# Patient Record
Sex: Male | Born: 1960 | Race: White | Hispanic: No | Marital: Married | Smoking: Former smoker
Health system: Southern US, Community
[De-identification: ages and names within clinical notes are randomized; demographics above are authoritative.]

## PROBLEM LIST (undated history)

## (undated) DIAGNOSIS — U071 COVID-19: Secondary | ICD-10-CM

## (undated) DIAGNOSIS — E119 Type 2 diabetes mellitus without complications: Secondary | ICD-10-CM

## (undated) HISTORY — PX: ROTATOR CUFF REPAIR: SHX139

## (undated) HISTORY — PX: OTHER SURGICAL HISTORY: SHX169

## (undated) HISTORY — PX: KNEE ARTHROSCOPY: SUR90

## (undated) HISTORY — DX: Type 2 diabetes mellitus without complications: E11.9

---

## 1997-12-31 ENCOUNTER — Emergency Department (HOSPITAL_COMMUNITY): Admission: EM | Admit: 1997-12-31 | Discharge: 1997-12-31 | Payer: Self-pay | Admitting: Emergency Medicine

## 1998-11-04 ENCOUNTER — Emergency Department (HOSPITAL_COMMUNITY): Admission: EM | Admit: 1998-11-04 | Discharge: 1998-11-04 | Payer: Self-pay | Admitting: Emergency Medicine

## 2001-04-02 ENCOUNTER — Encounter: Payer: Self-pay | Admitting: Family Medicine

## 2001-04-02 ENCOUNTER — Ambulatory Visit (HOSPITAL_COMMUNITY): Admission: RE | Admit: 2001-04-02 | Discharge: 2001-04-02 | Payer: Self-pay | Admitting: Family Medicine

## 2001-07-09 ENCOUNTER — Emergency Department (HOSPITAL_COMMUNITY): Admission: EM | Admit: 2001-07-09 | Discharge: 2001-07-10 | Payer: Self-pay | Admitting: *Deleted

## 2001-07-09 ENCOUNTER — Encounter: Payer: Self-pay | Admitting: *Deleted

## 2001-07-15 ENCOUNTER — Encounter: Payer: Self-pay | Admitting: Family Medicine

## 2001-07-15 ENCOUNTER — Ambulatory Visit (HOSPITAL_COMMUNITY): Admission: RE | Admit: 2001-07-15 | Discharge: 2001-07-15 | Payer: Self-pay | Admitting: Family Medicine

## 2003-07-31 ENCOUNTER — Observation Stay (HOSPITAL_COMMUNITY): Admission: EM | Admit: 2003-07-31 | Discharge: 2003-07-31 | Payer: Self-pay | Admitting: Emergency Medicine

## 2003-08-23 ENCOUNTER — Ambulatory Visit (HOSPITAL_COMMUNITY): Admission: RE | Admit: 2003-08-23 | Discharge: 2003-08-23 | Payer: Self-pay | Admitting: Orthopedic Surgery

## 2006-08-05 ENCOUNTER — Ambulatory Visit (HOSPITAL_COMMUNITY): Admission: RE | Admit: 2006-08-05 | Discharge: 2006-08-05 | Payer: Self-pay | Admitting: Family Medicine

## 2006-09-02 ENCOUNTER — Ambulatory Visit: Payer: Self-pay | Admitting: Gastroenterology

## 2006-09-05 ENCOUNTER — Ambulatory Visit (HOSPITAL_COMMUNITY): Admission: RE | Admit: 2006-09-05 | Discharge: 2006-09-05 | Payer: Self-pay | Admitting: Gastroenterology

## 2006-09-05 ENCOUNTER — Encounter (INDEPENDENT_AMBULATORY_CARE_PROVIDER_SITE_OTHER): Payer: Self-pay | Admitting: Specialist

## 2006-09-05 ENCOUNTER — Ambulatory Visit: Payer: Self-pay | Admitting: Gastroenterology

## 2008-06-02 ENCOUNTER — Ambulatory Visit (HOSPITAL_BASED_OUTPATIENT_CLINIC_OR_DEPARTMENT_OTHER): Admission: RE | Admit: 2008-06-02 | Discharge: 2008-06-03 | Payer: Self-pay | Admitting: Orthopedic Surgery

## 2009-02-08 ENCOUNTER — Emergency Department (HOSPITAL_COMMUNITY): Admission: EM | Admit: 2009-02-08 | Discharge: 2009-02-08 | Payer: Self-pay | Admitting: Emergency Medicine

## 2009-02-08 ENCOUNTER — Encounter: Payer: Self-pay | Admitting: Orthopedic Surgery

## 2009-02-10 ENCOUNTER — Ambulatory Visit: Payer: Self-pay | Admitting: Orthopedic Surgery

## 2009-02-10 DIAGNOSIS — S60229A Contusion of unspecified hand, initial encounter: Secondary | ICD-10-CM | POA: Insufficient documentation

## 2010-09-11 LAB — POCT HEMOGLOBIN-HEMACUE: Hemoglobin: 14 g/dL (ref 13.0–17.0)

## 2010-10-10 NOTE — Op Note (Signed)
NAME:  Jonathan Woodard, Jonathan Woodard NO.:  [id number]   MEDICAL RECORD NO.:  [id number]          PATIENT TYPE:  AMB   LOCATION:  DSC                          FACILITY:  [facility]   PHYSICIAN:  Loreta Ave, M.D. DATE OF BIRTH:  04/03/1961   DATE OF PROCEDURE:  06/02/2008  DATE OF DISCHARGE:  06/03/2008                               OPERATIVE REPORT   PREOPERATIVE DIAGNOSES:  Right shoulder impingement; biceps tendinitis;  dislocated biceps tendon; partial tearing, subscapularis tendon.   POSTOPERATIVE DIAGNOSIS:  Right shoulder impingement; biceps tendinitis;  dislocated biceps tendon; partial tearing, subscapularis tendon with  marked distal clavicle osteolysis.   PROCEDURES:  1. Right shoulder exam under anesthesia, arthroscopy, debridement      rotator cuff above and below.  2. Resection of intra-articular portion of the biceps.  3. Subacromial decompression of the acromioplasty, coracoacromial      ligament release, excision of distal clavicle.  4. Open extra-articular tenodesis, re-implantation of biceps into the      bicipital groove.  5. Repair of the superficial fibers of the subscapularis, which had      torn off and delaminated.  This was repaired with FiberWire      anchored down into the area of bicipital re-implantation.   SURGEON:  Loreta Ave, MD   ASSISTANT:  Genene Churn. Barry Dienes, [place], present throughout the entire case and  necessary for timely completion of the procedure.   ANESTHESIA:  General.   ESTIMATED BLOOD LOSS:  Minimal.   SPECIMENS:  None.   CULTURES:  None.   COMPLICATIONS:  None.   DRESSING:  Sterile compressive with shoulder immobilizer.   PROCEDURE:  The patient brought to the operating room, placed on the  operating table in supine position.  After adequate anesthesia had been  obtained, right shoulder examined.  Full motion and stable shoulder.  Placed in beach chair position on the shoulder positioner and prepped  and draped in  the usual sterile fashion.  Three portals anterior,  posterior, and lateral.  Shoulder entered with blunt obturator.  Arthroscope introduced.  Shoulder distended and inspected.  Extensive  marked tearing, intra-articular portion of the biceps tendon.  This was  dislocated out of the groove into a delamination tear of the subscap.  The deep layers of subscap is still intact.  Some thinning of the  supraspinatus, no full-thickness tears.  Articular cartilage remaining  labrum intact.  Biceps detached what was left from the top of the  glenoid for extra-articular tenodesis.  Cannula re-directed  subacromially.  Fair amount of attrition on the supraspinatus debrided.  No full-thickness tears.  Type 2 acromion.  Bursa resected.  Acromioplasty to a type 1 acromion releasing CA ligament.  Grade 4  changes AC joint with marked spurring.  Periarticular spurs and lateral  centimeter of clavicle resected.  Adequacy of decompression confirmed  viewing from all portals.  Instruments and fluid removed.  Anterior  incision right over the bicipital groove splitting the deltoid.  Retractor put in place.  Biceps pulled out of the shoulder.  Debrided  back to appropriate  length and good tissue quality.  Captured with a  weaved FiberWire suture.  The subscap deep fibers were intact on the  other side of the groove, but superficial portion of this about 50% of  the thickness had pulled off, delaminated, and a little bit retracted.  Very reasonable tissue quality.  A drill hole was made in the bicipital  groove just adjacent to the subscap.  Two small exiting holes.  FiberWire brought into a big hole and then out through the small holes.  This was then pulled off in tension bringing the biceps down into the  large hole and then the sutures tied over top of this to re-ensure good  implantation.  When that was complete, I used 0 sutures with a free  needle to weave into the subscap, pulled it back over to its  attachment  site and then firmly tied down there.  This gave a good tenodesis as  well as good repair of the subscap with good continuity.  I could still  externally rotate and reasonably without too much tension on the subscap  repair.  Wound irrigated.  Deltoid allowed to close.  Subcutaneous,  subcuticular closure of the incision.  Portals closed with nylon.  Sterile compressive dressing applied.  Shoulder immobilizer applied.  Anesthesia reversed.  Brought to the recovery room.  Tolerated the  surgery well.  No complications.      Loreta Ave, M.D.  Electronically Signed     DFM/MEDQ  D:  06/03/2008  T:  06/03/2008  Job:  [id number]

## 2010-10-13 NOTE — Op Note (Signed)
NAME:  Jonathan Woodard, Jonathan Woodard                  ACCOUNT NO.:  [id number]   MEDICAL RECORD NO.:  [id number]          PATIENT TYPE:  AMB   LOCATION:  DAY                           FACILITY:  [facility]   PHYSICIAN:  Kassie Mends, M.D.      DATE OF BIRTH:  26-Feb-1961   DATE OF PROCEDURE:  09/05/2006  DATE OF DISCHARGE:                               OPERATIVE REPORT   REFERRING PHYSICIAN:  Angus G. Renard Matter, MD.   INDICATION FOR EXAM:  [name] is a 50 year old male who presents with  heme-positive stool.   PROCEDURES:  1. Colonoscopy.  2. Esophagogastroduodenoscopy with cold forceps biopsies.   FINDINGS:  1. Sigmoid diverticulosis.  2. External and internal hemorrhoids.  Otherwise no polyps, masses,      inflammatory changes or AVMs seen.  3. Diffuse antral erythema and erosions without ulcerations.      Otherwise normal esophagus without evidence of Barrett's, erosions      or ulcerations.  Normal duodenal bulb and second portion of the      duodenum.   RECOMMENDATIONS:  1. Screening colonoscopy in 10 years.  2. High fiber diet.  He should also avoid gastric irritants.  He is      given a handout on diverticulosis, hemorrhoids, high-fiber diet and      gastritis as well as gastric irritants.  3. He should avoid aspirin and anti-inflammatory drugs for 30 days.  4. He is to begin omeprazole 20 mg daily for 30 days.  He is given a      prescription for omeprazole.  5. He has a follow-up appointment to see me on June10, 2008, at 9:30      a.m.   PROCEDURE TECHNIQUE:  Physical exam was performed and informed consent  was obtained from the patient after explaining the benefits, risks and  alternatives to the procedure.  The patient was connected to the monitor  and placed in the left lateral position.  Continuous oxygen was provided  by nasal cannula and IV medicine administered through an indwelling  cannula.  After administration of sedation and rectal exam, the  patient's rectum was intubated and  the scope was advanced under direct  visualization to the cecum.  The scope was subsequently removed slowly  by carefully examining the color, texture, anatomy and integrity of the  mucosa on the way out.   After the colonoscopy, the diagnostic gastroscope was used to intubate  the patient's esophagus.  The scope was advanced under direct  visualization to the second portion of the duodenum.  The scope was  subsequently removed slowly by carefully examining the color, texture,  anatomy and integrity of the mucosa on the way out.  The patient was  recovered in endoscopy and discharged home in satisfactory condition.      Kassie Mends, M.D.  Electronically Signed     SM/MEDQ  D:  09/05/2006  T:  09/05/2006  Job:  [id number]   cc:   Angus G. Renard Matter, MD  Fax: [phone]

## 2010-10-13 NOTE — Op Note (Signed)
NAME:  Jonathan Woodard, Jonathan Woodard                            ACCOUNT NO.:  [id number]   MEDICAL RECORD NO.:  [id number]                   PATIENT TYPE:  OBV   LOCATION:  1827                                 FACILITY:  [facility]   PHYSICIAN:  Jimmye Norman III, M.D.               DATE OF BIRTH:  1961/02/23   DATE OF PROCEDURE:  07/31/2003  DATE OF DISCHARGE:  07/31/2003                                 OPERATIVE REPORT   PREOPERATIVE DIAGNOSIS:  Perianal abscess with possible fistula.   POSTOPERATIVE DIAGNOSIS:  Perianal abscess with fistula in the 12 o'clock  position.   PROCEDURE:  1. Incision and drainage of perianal and peroneal abscess.  2. Fistulotomy.   SURGEON:  Jimmye Norman, M.D.   ANESTHESIA:  General endotracheal done in the lithotomy position.   ESTIMATED BLOOD LOSS:  Less than 25-30 mL.   COMPLICATIONS:  None.   CONDITION:  Stable.   INDICATIONS:  The patient is a 50 year old with a recurrent perianal abscess  who comes in for drainage.   FINDINGS:  At the time of surgery, the patient was found to have a large  indurated area at the 12 o'clock position extending laterally to about 2  o'clock.  About 5-6 mL of pus was drained from the wound as we opened it,  but it had spontaneously started to drain on its own.  The fistula was in  the 12 o'clock.  It did not go above the sphincter and could be opened by  simply opening mucosa of that area over a hemostat clamp.   DESCRIPTION OF PROCEDURE:  The patient was taken to the operating room and  placed on the table in the supine position. After an adequate amount of  endotracheal anesthetic was administered, he was placed in the lithotomy and  prepped with Betadine.   The opening of the abscess was almost directly at 12 o'clock with that being  directly anterior.  We inserted an anal speculum where we could see the  fistulous tract open very easily using a hemostat clamp.  It opened  approximately just above the dentate line.  It did not  appear to go behind  the muscle.  We split it along the tract using electrocautery.  We  subsequently rubbed out the internal contents using a rough gauze.  Once we  had done so, we drained the indurated area superiorly along going towards  the scrotum and laterally to the left where there did not appear to be any  further pockets of pus but mostly induration.  Once we had done so, we did  pack these areas with  about 3 feet of 1/2 inch Iodoform Nugauze.  We all placed a Dibucaine soaked  Gelfoam in the anus to help with hemostasis and pain control.  The patient  was then taken to the recovery room in stable condition with dressing in  place.  Kathrin Ruddy, M.D.    JW/MEDQ  D:  07/31/2003  T:  08/01/2003  Job:  [id number]

## 2010-10-13 NOTE — H&P (Signed)
NAME:  Jonathan Woodard, Jonathan Woodard                            ACCOUNT NO.:  [id number]   MEDICAL RECORD NO.:  [id number]                   PATIENT TYPE:  OBV   LOCATION:  1827                                 FACILITY:  [facility]   PHYSICIAN:  Jimmye Norman III, M.D.               DATE OF BIRTH:  May 29, 1960   DATE OF ADMISSION:  07/31/2003  DATE OF DISCHARGE:  07/31/2003                                HISTORY & PHYSICAL   PREOPERATIVE HISTORY AND PHYSICAL:   IDENTIFICATION/CHIEF COMPLAINT:  The patient is a 50 year old with a  perianal abscess.   HISTORY OF PRESENT ILLNESS:  I was called this morning by the emergency room  physician Dr. Cathren Laine for a gentleman with a perianal abscess which has  been there since about 3:30 to 4 o'clock in the morning.  He had drainage on  this occasion since yesterday, actually since he came into the ER, but he  had significant pain prior to that with swelling in the left gluteal  perianal area and also extending up towards the scrotum.  He is not diabetic  but he has had recurrent abscesses before.   His past medical history is significant for rheumatoid fever in 1976 and he  has been told to take antibiotics with dental procedures but has no known  murmur.  He takes no medications.  He has no known drug allergies.  Only  surgical history has been drainage of perirectal abscesses before.   REVIEW OF SYSTEMS:  He has had bowel movements which are painful, he has had  drainage before but not recently, no abdominal pain, he has no chest pain or  shortness of breath.   SOCIAL HISTORY:  The patient works in a Psychologist, prison and probation services.  He is an ex-smoker  and drinks occasionally.   PHYSICAL EXAMINATION:  VITALS:  He is afebrile, other vital signs are  stable.  HEENT:  He is normocephalic and atraumatic and anicteric.  NECK:  Supple.  CHEST:  Clear.  CARDIAC:  No murmurs, gallops, rubs, or heaves.  ABDOMEN:  Soft and nontender.  RECTAL:  He has got very tender firm area just  to the left of the scrotum at  2 o'clock and directly anteriorly at 12 o'clock.  There is some purulent  drainage.   IMPRESSION:  Perianal/perirectal abscess.   PLAN:  Examine under anesthesia, drain the abscess, and also do a  fistulotomy if possible.  The risks and benefits of the procedure have been  explained to the patient and he wishes to proceed as soon as possible.                                                Kathrin Ruddy, M.D.    JW/MEDQ  D:  07/31/2003  T:  07/31/2003  Job:  [id number]

## 2010-10-13 NOTE — Consult Note (Signed)
NAME:  Jonathan Woodard, Jonathan Woodard                  ACCOUNT NO.:  [id number]   MEDICAL RECORD NO.:  [id number]          PATIENT TYPE:  AMB   LOCATION:  DAY                           FACILITY:  [facility]   PHYSICIAN:  Kassie Mends, M.D.      DATE OF BIRTH:  08/16/1960   DATE OF CONSULTATION:  09/02/1999  DATE OF DISCHARGE:                                 CONSULTATION   REASON FOR CONSULTATION:  Heme-positive stool.   HISTORY OF PRESENT ILLNESS:  Jonathan Woodard is a 50 year old male who was  seen on March 13th for a routine physical exam.  He had heme-positive  stool on physical exam.  He had a hemoglobin checked on March 14th,  which was 14.3.  He denies seeing any blood in his stool.  He does have  a history of having a boil being lanced occasionally over the last 10-12  years.  He states it was not acting up at the time.  He states he was  not acting up at the time, denies any history of hemorrhoids.  He has  two to three bowel movements a day.  He denies an constipation or  diarrhea.  He does not use aspirin, BC's, Goody's, Ibuprofen or Aleve.  He uses Tylenol as needed.  He denies any heartburn.  He has  indigestion, depending on what he eats.  He has no problem swallowing,  black stools, weight loss or abdominal pain.   PAST MEDICAL HISTORY:  None.   PAST SURGICAL HISTORY:  None.   ALLERGIES:  No known drug allergies.   MEDICATIONS:  None.   FAMILY HISTORY:  He has no family history of colon cancer, colon polyps.  His mother had what sounds like peritoneal carcinomatosis of unclear  etiology.   SOCIAL HISTORY:  He is married and has two girls.  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  He denies any tobacco or alcohol use.   REVIEW OF SYSTEMS:  As per the HPI, otherwise all systems are negative.   PHYSICAL EXAMINATION:  VITAL SIGNS:  Weight 224 pounds, height 5 foot 11  inches, BMI 31.2 (obese).  Temperature is 98, blood pressure 108/78,  pulse 92.GENERAL:  He is in no apparent distress,  alert and oriented  x4.HEENT:  Atraumatic normocephalic, pupils equal and reactive to light.  Mouth:  No oral lesions.  Posterior pharynx without erythema or exudate.  NECK:  Has full range of motion, no lymphadenopathy.LUNGS:  Clear to  auscultation bilaterally.CARDIOVASCULAR:  Regular rhythm with no murmur,  normal S1 and S2.ABDOMEN:  Bowel sounds present, soft, nontender,  nondistended.  No rebound, no guarding, no hepatosplenomegaly, abdominal  bruits or pulsatile masses, obese. EXTREMITIES:  Without cyanosis,  clubbing or edema.  NEURO:  He has no focal neurologic deficits.   LABORATORY:  From March 2008 - Sodium 141, potassium 4.4, BUN 11,  creatinine 0.185, AST 19, ALT 19, albumin 4.3, platelets 300,  triglycerides 55, TSH 2.155.   ASSESSMENT:  Mr.. Woodard is a 50 year old male with heme-positive stool.  Most likely etiology is in general hemorrhoids.  The  differential  diagnosis includes colorectal polyps and a low likelihood of colorectal  cancer.  He could have an upper GI source, such as esophagitis or  gastritis.  Thank you for allowing me to see Jonathan Woodard in consultation.  My recommendations follow.   RECOMMENDATIONS:  1. Jonathan Woodard will be scheduled for a colonoscopy with Osmo prep.  If      no source for his heme-positive stools are found, then an EGD will      be performed.  A capsule endoscopy will be performed to complete      his evaluation, if no source is found on colonoscopy or EGD.  2. He may follow up with me as needed.      Kassie Mends, M.D.  Electronically Signed     SM/MEDQ  D:  09/02/2006  T:  09/02/2006  Job:  [id number]   cc:   Jonathan G. Renard Matter, MD  Fax: [phone]

## 2011-02-18 ENCOUNTER — Emergency Department (HOSPITAL_COMMUNITY): Payer: BC Managed Care – PPO

## 2011-02-18 ENCOUNTER — Emergency Department (HOSPITAL_COMMUNITY)
Admission: EM | Admit: 2011-02-18 | Discharge: 2011-02-18 | Disposition: A | Discharge status: Home / Self Care | Payer: BC Managed Care – PPO | Attending: Emergency Medicine | Admitting: Emergency Medicine

## 2011-02-18 DIAGNOSIS — S40012A Contusion of left shoulder, initial encounter: Secondary | ICD-10-CM

## 2011-02-18 DIAGNOSIS — S43402A Unspecified sprain of left shoulder joint, initial encounter: Secondary | ICD-10-CM

## 2011-02-18 DIAGNOSIS — IMO0002 Reserved for concepts with insufficient information to code with codable children: Secondary | ICD-10-CM | POA: Insufficient documentation

## 2011-02-18 DIAGNOSIS — S40019A Contusion of unspecified shoulder, initial encounter: Secondary | ICD-10-CM | POA: Insufficient documentation

## 2011-02-18 DIAGNOSIS — S20219A Contusion of unspecified front wall of thorax, initial encounter: Secondary | ICD-10-CM | POA: Insufficient documentation

## 2011-02-18 DIAGNOSIS — S060X1A Concussion with loss of consciousness of 30 minutes or less, initial encounter: Secondary | ICD-10-CM | POA: Insufficient documentation

## 2011-02-18 MED ORDER — OXYCODONE-ACETAMINOPHEN 5-325 MG PO TABS: 2.0000 | ORAL_TABLET | ORAL | Status: AC | PRN

## 2011-02-18 MED ORDER — ONDANSETRON 8 MG PO TBDP
8.0000 mg | ORAL_TABLET | Freq: Once | ORAL | Status: AC
  Administered 2011-02-18: 8 mg via ORAL
  Filled 2011-02-18: qty 1

## 2011-02-18 MED ORDER — HYDROMORPHONE HCL 1 MG/ML IJ SOLN
1.0000 mg | Freq: Once | INTRAMUSCULAR | Status: AC
  Administered 2011-02-18: 1 mg via INTRAVENOUS
  Filled 2011-02-18: qty 1

## 2011-02-18 NOTE — ED Notes (Signed)
Cervical collar applied
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
## 2011-02-18 NOTE — ED Notes (Signed)
Patient c-collar removed via dr. Fonnie Jarvis

## 2011-02-18 NOTE — ED Notes (Signed)
Pt remains flat on stretcher, states is feeling better, good color, skin warm and dry, pt denies complaints.
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
## 2011-02-18 NOTE — ED Notes (Signed)
c-collar placed in triage, report given to penny accepting rn

## 2011-02-18 NOTE — ED Notes (Signed)
Pt became unresponsive for a few minutes after receiving dilaudid.   Pt brought back around with sternal rub, diaphoretic, cool, clammy.   Pt placed back on stretcher in supine position with dr bednar at bedside.  Pt with eyes open now, denies complaints.   Pt placed on nrb oxygen 15 lpm.

## 2011-03-22 ENCOUNTER — Encounter (HOSPITAL_COMMUNITY): Payer: Self-pay | Admitting: *Deleted

## 2011-03-22 ENCOUNTER — Emergency Department (HOSPITAL_COMMUNITY)
Admission: EM | Admit: 2011-03-22 | Discharge: 2011-03-22 | Disposition: A | Discharge status: Home / Self Care | Payer: BC Managed Care – PPO | Attending: Emergency Medicine | Admitting: Emergency Medicine

## 2011-03-22 DIAGNOSIS — X58XXXA Exposure to other specified factors, initial encounter: Secondary | ICD-10-CM | POA: Insufficient documentation

## 2011-03-22 DIAGNOSIS — T148XXA Other injury of unspecified body region, initial encounter: Secondary | ICD-10-CM | POA: Insufficient documentation

## 2011-03-22 DIAGNOSIS — Z87891 Personal history of nicotine dependence: Secondary | ICD-10-CM | POA: Insufficient documentation

## 2011-03-22 DIAGNOSIS — S39012A Strain of muscle, fascia and tendon of lower back, initial encounter: Secondary | ICD-10-CM

## 2011-03-22 DIAGNOSIS — M25519 Pain in unspecified shoulder: Secondary | ICD-10-CM | POA: Insufficient documentation

## 2011-03-22 DIAGNOSIS — R10819 Abdominal tenderness, unspecified site: Secondary | ICD-10-CM | POA: Insufficient documentation

## 2011-03-22 DIAGNOSIS — M549 Dorsalgia, unspecified: Secondary | ICD-10-CM | POA: Insufficient documentation

## 2011-03-22 DIAGNOSIS — S335XXA Sprain of ligaments of lumbar spine, initial encounter: Secondary | ICD-10-CM | POA: Insufficient documentation

## 2011-03-22 LAB — COMPREHENSIVE METABOLIC PANEL
ALT: 17 U/L (ref 0–53)
AST: 19 U/L (ref 0–37)
Albumin: 3.8 g/dL (ref 3.5–5.2)
Alkaline Phosphatase: 89 U/L (ref 39–117)
BUN: 13 mg/dL (ref 6–23)
Chloride: 101 mEq/L (ref 96–112)
Potassium: 3.7 mEq/L (ref 3.5–5.1)
Sodium: 138 mEq/L (ref 135–145)
Total Bilirubin: 0.2 mg/dL — ABNORMAL LOW (ref 0.3–1.2)
Total Protein: 6.7 g/dL (ref 6.0–8.3)

## 2011-03-22 LAB — URINALYSIS, ROUTINE W REFLEX MICROSCOPIC
Bilirubin Urine: NEGATIVE
Glucose, UA: NEGATIVE mg/dL
Hgb urine dipstick: NEGATIVE
Ketones, ur: NEGATIVE mg/dL
Leukocytes, UA: NEGATIVE
Protein, ur: NEGATIVE mg/dL
pH: 6.5 (ref 5.0–8.0)

## 2011-03-22 LAB — DIFFERENTIAL
Basophils Absolute: 0 10*3/uL (ref 0.0–0.1)
Basophils Relative: 0 % (ref 0–1)
Eosinophils Absolute: 0.3 10*3/uL (ref 0.0–0.7)
Monocytes Relative: 8 % (ref 3–12)
Neutro Abs: 8 10*3/uL — ABNORMAL HIGH (ref 1.7–7.7)
Neutrophils Relative %: 74 % (ref 43–77)

## 2011-03-22 LAB — CBC
Hemoglobin: 13.4 g/dL (ref 13.0–17.0)
MCHC: 33.4 g/dL (ref 30.0–36.0)
Platelets: 277 10*3/uL (ref 150–400)

## 2011-03-22 MED ORDER — NAPROXEN 500 MG PO TABS: 500.0000 mg | ORAL_TABLET | Freq: Two times a day (BID) | ORAL | Status: AC

## 2011-03-22 MED ORDER — KETOROLAC TROMETHAMINE 30 MG/ML IJ SOLN
30.0000 mg | Freq: Once | INTRAMUSCULAR | Status: AC
  Administered 2011-03-22: 30 mg via INTRAVENOUS
  Filled 2011-03-22 (×2): qty 1

## 2011-03-22 NOTE — ED Notes (Signed)
Blood and urine to lab
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
## 2013-01-28 ENCOUNTER — Other Ambulatory Visit: Payer: Self-pay | Admitting: Orthopedic Surgery

## 2013-01-30 ENCOUNTER — Encounter (HOSPITAL_BASED_OUTPATIENT_CLINIC_OR_DEPARTMENT_OTHER): Admission: RE | Payer: Self-pay | Source: Ambulatory Visit

## 2013-01-30 ENCOUNTER — Ambulatory Visit (HOSPITAL_BASED_OUTPATIENT_CLINIC_OR_DEPARTMENT_OTHER)
Admission: RE | Admit: 2013-01-30 | Payer: BC Managed Care – PPO | Source: Ambulatory Visit | Admitting: Orthopedic Surgery

## 2013-01-30 SURGERY — SHOULDER ARTHROSCOPY WITH ROTATOR CUFF REPAIR AND SUBACROMIAL DECOMPRESSION
Anesthesia: General | Laterality: Left

## 2014-11-04 ENCOUNTER — Encounter (HOSPITAL_COMMUNITY): Payer: Self-pay | Admitting: Emergency Medicine

## 2014-11-04 ENCOUNTER — Emergency Department (HOSPITAL_COMMUNITY)
Admission: EM | Admit: 2014-11-04 | Discharge: 2014-11-04 | Disposition: A | Discharge status: Home / Self Care | Payer: BLUE CROSS/BLUE SHIELD | Attending: Emergency Medicine | Admitting: Emergency Medicine

## 2014-11-04 DIAGNOSIS — Y9389 Activity, other specified: Secondary | ICD-10-CM | POA: Insufficient documentation

## 2014-11-04 DIAGNOSIS — Y9289 Other specified places as the place of occurrence of the external cause: Secondary | ICD-10-CM | POA: Diagnosis not present

## 2014-11-04 DIAGNOSIS — Z87891 Personal history of nicotine dependence: Secondary | ICD-10-CM | POA: Insufficient documentation

## 2014-11-04 DIAGNOSIS — Y998 Other external cause status: Secondary | ICD-10-CM | POA: Diagnosis not present

## 2014-11-04 DIAGNOSIS — T1502XA Foreign body in cornea, left eye, initial encounter: Secondary | ICD-10-CM | POA: Diagnosis not present

## 2014-11-04 DIAGNOSIS — X58XXXA Exposure to other specified factors, initial encounter: Secondary | ICD-10-CM | POA: Diagnosis not present

## 2014-11-04 DIAGNOSIS — S0592XA Unspecified injury of left eye and orbit, initial encounter: Secondary | ICD-10-CM | POA: Diagnosis present

## 2014-11-04 MED ORDER — TETRACAINE HCL 0.5 % OP SOLN
2.0000 [drp] | Freq: Once | OPHTHALMIC | Status: AC
  Administered 2014-11-04: 2 [drp] via OPHTHALMIC
  Filled 2014-11-04: qty 2

## 2014-11-04 NOTE — ED Notes (Signed)
Pt. Reports left eye pain starting today. 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

## 2014-11-04 NOTE — Discharge Instructions (Signed)
Eye, Foreign Body °A foreign body is an object that should not be there. The object could be near, on, or in the eye. °HOME CARE °If your doctor prescribes an eye patch: °· Keep the eye patch on. Do this until you see your doctor again. °· Do not remove the patch to put in medicine unless your doctor tells you. °· Retape it as it was before: °¨ When replacing the patch. °¨ If the patch comes loose. °· Do not drive or use machinery. °· Only take medicine as told by your doctor. °If your doctor does not prescribe an eye patch: °· Keep the eye closed as much as possible. °· Do not rub the eye. °· Wear dark glasses in bright light. °· Do not wear contact lenses until the eye feels normal, or as told by your doctor. °· Wear protective eye covering, especially when using high speed tools. °· Only take medicine as told by your doctor. °GET HELP RIGHT AWAY IF:  °· Your pain gets worse. °· Your vision changes. °· You have problems with the eye patch. °· The injury gets larger. °· There is fluid (discharge) coming from the eye. °· You get puffiness (swelling) and soreness. °· You have an oral temperature above 102° F (38.9° C), not controlled by medicine. °· Your baby is older than 3 months with a rectal temperature of 102° F (38.9° C) or higher. °· Your baby is 3 months old or younger with a rectal temperature of 100.4° F (38° C) or higher. °MAKE SURE YOU:  °· Understand these instructions. °· Will watch your condition. °· Will get help right away if you are not doing well or get worse. °Document Released: 11/01/2009 Document Revised: 08/06/2011 Document Reviewed: 10/09/2012 °Exit[employer]® Patient Information ©2015 [employer]. This information is not intended to replace advice given to you by your health care provider. Make sure you discuss any questions you have with your health care provider. ° °

## 2014-11-04 NOTE — ED Provider Notes (Signed)
CSN: [id number]     Arrival date & time 11/04/14  1917 History   First MD Initiated Contact with Patient 11/04/14 1943     Chief Complaint  Patient presents with  . Eye Pain     (Consider location/radiation/quality/duration/timing/severity/associated sxs/prior Treatment) HPI   Jonathan Woodard is a 54 y.o. male who presents to the Emergency Department complaining of foreign body sensation to left eye.  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  Reports incident occurred earlier on the day of arrival.  He tried flushing with water, but no other therapies.  He has blurred vision of the left eye and reports photophobia.  He denies any other symptoms.  States he was wearing a welders hat and denies contact use.     Past Medical History  Diagnosis Date  . Concussion    Past Surgical History  Procedure Laterality Date  . Rotator cuff repair     No family history on file. History  Substance Use Topics  . Smoking status: Former Games developer  . Smokeless tobacco: Not on file  . Alcohol Use: No    Review of Systems  Constitutional: Negative for fever, chills and fatigue.  HENT: Negative for sore throat and trouble swallowing.   Eyes: Positive for pain.  Respiratory: Negative for cough, shortness of breath and wheezing.   Cardiovascular: Negative for chest pain and palpitations.  Gastrointestinal: Negative for nausea, vomiting, abdominal pain and blood in stool.  Genitourinary: Negative for dysuria, hematuria and flank pain.  Musculoskeletal: Negative for myalgias, back pain, arthralgias, neck pain and neck stiffness.  Skin: Negative for rash.  Neurological: Negative for dizziness, weakness and numbness.  Hematological: Does not bruise/bleed easily.  All other systems reviewed and are negative.     Allergies  Dilaudid  Home Medications   Prior to Admission medications   Not on File   BP 139/82 mmHg  Pulse 82  Temp(Src) 97.6 F (36.4 C) (Oral)  Resp 16  Ht   (1.803 m)  Wt 218 lb (98.884 kg)  BMI 30.42 kg/m2  SpO2 97% Physical Exam  Constitutional: He is oriented to person, place, and time. He appears well-developed and well-nourished. No distress.  HENT:  Head: Normocephalic and atraumatic.  Eyes: EOM are normal. Pupils are equal, round, and reactive to light. Lids are everted and swept, no foreign bodies found. Right conjunctiva is not injected. Left conjunctiva is not injected.  Pin point FB present to left cornea at 5:00 o'clock.  Slit lamp exam reveals negative Seidel's sign, no hyphema. Globe intact. No corneal burns  Cardiovascular: Normal rate and regular rhythm.   Pulmonary/Chest: Effort normal and breath sounds normal. No respiratory distress.  Musculoskeletal: Normal range of motion.  Neurological: He is alert and oriented to person, place, and time. He exhibits normal muscle tone. Coordination normal.  Skin: Skin is warm and dry. No rash noted.  Nursing note and vitals reviewed.   ED Course  Procedures (including critical care time) Labs Review Labs Reviewed - No data to display  Imaging Review No results found.   EKG Interpretation None      MDM   Final diagnoses:  Foreign body in cornea, left, initial encounter   Pt also seen by Dr Roselyn Bering.    Small FB remains to cornea of left eye despite multiple attempts to remove.  No rust ring present.  Will consult ophtho. Eye patched for comfort  Consulted Dr. Lita Mains.  Will see pt  in his office tomorrow at 8:30 am.      Pauline Aus, PA-C 11/07/14 0011  Linwood Dibbles, MD 11/09/14 1136

## 2014-11-04 NOTE — ED Notes (Signed)
Patch placed over pt's LT eye

## 2016-07-06 ENCOUNTER — Telehealth: Payer: Self-pay | Admitting: Gastroenterology

## 2016-07-06 NOTE — Telephone Encounter (Signed)
Per Leeanne RioSusan, [facility] called to see when pt was due. I will send out Recall letter late March.

## 2016-07-06 NOTE — Telephone Encounter (Signed)
Pt is due a 10 yr colonoscopy in April 2018. It wasn't nic'ed on the recall list.

## 2016-07-31 NOTE — Telephone Encounter (Signed)
Tried to call with no answer  

## 2016-08-01 NOTE — Telephone Encounter (Signed)
LMOM to call back

## 2016-08-06 NOTE — Telephone Encounter (Signed)
LMOM to call back

## 2016-08-09 NOTE — Telephone Encounter (Signed)
Tried to call with no answer . Letter mailed

## 2016-08-31 ENCOUNTER — Emergency Department (HOSPITAL_COMMUNITY): Payer: Self-pay

## 2016-08-31 ENCOUNTER — Emergency Department (HOSPITAL_COMMUNITY)
Admission: EM | Admit: 2016-08-31 | Discharge: 2016-08-31 | Disposition: A | Discharge status: Home / Self Care | Payer: Self-pay | Attending: Emergency Medicine | Admitting: Emergency Medicine

## 2016-08-31 ENCOUNTER — Encounter (HOSPITAL_COMMUNITY): Payer: Self-pay

## 2016-08-31 DIAGNOSIS — W231XXA Caught, crushed, jammed, or pinched between stationary objects, initial encounter: Secondary | ICD-10-CM | POA: Insufficient documentation

## 2016-08-31 DIAGNOSIS — Z87891 Personal history of nicotine dependence: Secondary | ICD-10-CM | POA: Insufficient documentation

## 2016-08-31 DIAGNOSIS — Y999 Unspecified external cause status: Secondary | ICD-10-CM | POA: Insufficient documentation

## 2016-08-31 DIAGNOSIS — Y939 Activity, unspecified: Secondary | ICD-10-CM | POA: Insufficient documentation

## 2016-08-31 DIAGNOSIS — Y929 Unspecified place or not applicable: Secondary | ICD-10-CM | POA: Insufficient documentation

## 2016-08-31 DIAGNOSIS — S60221A Contusion of right hand, initial encounter: Secondary | ICD-10-CM | POA: Insufficient documentation

## 2016-08-31 NOTE — Discharge Instructions (Signed)
Elevate and apply ice packs on and off to your hand.  No heavy lifting with the right hand for 1 week. Call Dr. Mort Sawyers office to arrange a follow-up appointment next week if not improving.  You can take Ibuprofen if needed for pain

## 2016-08-31 NOTE — ED Triage Notes (Signed)
Pt reports that right hand got squeezed between barn and bull yesterday. Right hand and wrist swollen at this time

## 2016-08-31 NOTE — ED Provider Notes (Signed)
[facility]EMER[facility]Y DEPT Provider Note   CSN: [id number] Arrival date & time: 08/31/16  1130     History   Chief Complaint Chief Complaint  Patient presents with  . Hand Pain    HPI Jonathan Woodard is a 56 y.o. male.  HPI  Jonathan Woodard is a 56 y.o. male who presents to the Emergency Department complaining of a crush injury to the right hand that occurred one day prior to arrival. He states that his hand was mashed between a bull and a piece of wood on the barn.  He describes a throbbing pain and swelling of the hand.  Pain with gripping.  Pain radiates to his wrist.  No alleviating factors.  He denies numbness to the fingers, pain to the elbow, or open wounds.   Past Medical History:  Diagnosis Date  . Concussion     Patient Active Problem List   Diagnosis Date Noted  . CONTUSION, RIGHT HAND 02/10/2009    Past Surgical History:  Procedure Laterality Date  . ROTATOR CUFF REPAIR         Home Medications    Prior to Admission medications   Not on File    Family History No family history on file.  Social History Social History  Substance Use Topics  . Smoking status: Former Games developer  . Smokeless tobacco: Never Used  . Alcohol use No     Allergies   Dilaudid [hydromorphone hcl]   Review of Systems Review of Systems  Constitutional: Negative for chills and fever.  Musculoskeletal: Positive for arthralgias (right hand pain) and joint swelling.  Skin: Negative for color change and wound.  Neurological: Negative for weakness and numbness.  Hematological: Does not bruise/bleed easily.  All other systems reviewed and are negative.    Physical Exam Updated Vital Signs BP (!) 144/90 (BP Location: Right Arm)   Pulse 89   Temp 98.3 F (36.8 C) (Oral)   Resp 17   Ht  (1.803 m)   Wt 102.1 kg   SpO2 98%   BMI 31.38 kg/m   Physical Exam  Constitutional: He is oriented to person, place, and time. He appears well-developed and well-nourished. No distress.   HENT:  Head: Normocephalic and atraumatic.  Cardiovascular: Normal rate, regular rhythm and intact distal pulses.   Pulmonary/Chest: Effort normal and breath sounds normal.  Musculoskeletal: He exhibits tenderness. He exhibits no edema or deformity.  Diffuse ttp of the dorsal right hand.  No bruising or bony deformity.  Patient has full ROM. Compartments soft.  Neurological: He is alert and oriented to person, place, and time. No sensory deficit. He exhibits normal muscle tone. Coordination normal.  Skin: Skin is warm and dry.  Nursing note and vitals reviewed.    ED Treatments / Results  Labs (all labs ordered are listed, but only abnormal results are displayed) Labs Reviewed - No data to display  EKG  EKG Interpretation None       Radiology Dg Hand Complete Right  Result Date: 08/31/2016 CLINICAL DATA:  Right hand pain.  Injured yesterday. EXAM: RIGHT HAND - COMPLETE 3+ VIEW COMPARISON:  02/08/2009 FINDINGS: There is a subtle lucency along the radial base of the fourth metacarpal which may be projectional versus a subtle nondisplaced fracture. Correlate with point tenderness. There is no other fracture or dislocation. 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 There is mild osteoarthritis of the first IP joint. There is mild osteoarthritis of the first MCP joint. There  is mild osteoarthritis of the second DIP joint. There is no soft tissue swelling. IMPRESSION: Subtle lucency along the radial base of the fourth metacarpal which may be projectional versus a subtle nondisplaced fracture. Correlate with point tenderness. No other fracture or dislocation. Electronically Signed   By: Elige Ko   On: 08/31/2016 12:21    Procedures Procedures (including critical care time)  Medications Ordered in ED Medications - No data to display   Initial Impression / Assessment and Plan / ED Course  I have reviewed the triage vital signs and the nursing notes.  Pertinent labs &  imaging results that were available during my care of the patient were reviewed by me and considered in my medical decision making (see chart for details).     Patient with likely contusion of the right hand. No tenderness of the right wrist or fourth metacarpal. Patient has full range of motion of the fourth finger without tenderness. Doubtful that irregularity seen on x-ray is an acute fracture.   Neurovascularly intact. No open wounds. Patient agrees to RICE therapy, Jones dressing applied, has ibuprofen at home for pain.  I have recommended orthopedic follow-up next week if his symptoms are not improving, patient has seen Dr. Romeo Apple in the past and prefers to follow-up with him.  Final Clinical Impressions(s) / ED Diagnoses   Final diagnoses:  Contusion of right hand, initial encounter    New Prescriptions New Prescriptions   No medications on file     Pauline Aus, Cordelia Poche 09/01/16 1456    Eber Hong, MD 09/01/16 1945

## 2017-02-19 ENCOUNTER — Ambulatory Visit (HOSPITAL_COMMUNITY): Payer: Self-pay | Attending: Orthopedic Surgery

## 2017-02-19 DIAGNOSIS — M25561 Pain in right knee: Secondary | ICD-10-CM | POA: Insufficient documentation

## 2017-02-19 DIAGNOSIS — R262 Difficulty in walking, not elsewhere classified: Secondary | ICD-10-CM | POA: Insufficient documentation

## 2017-02-19 DIAGNOSIS — M6281 Muscle weakness (generalized): Secondary | ICD-10-CM | POA: Insufficient documentation

## 2017-02-19 NOTE — Therapy (Signed)
[facility] [address] [place], [place], [address] Phone: [phone]   Fax:  [phone]  Physical Therapy Evaluation  Patient Details  Name: Jonathan Woodard MRN: [id number] Date of Birth: May 15, 1961 Referring Provider: Frederico Hamman   Encounter Date: 02/19/2017      PT End of Session - 02/19/17 1048    Visit Number 1   Number of Visits 12   Date for PT Re-Evaluation 04/02/17   Authorization Type Pt is self pay, does not have insurance    Authorization - Visit Number 1   Authorization - Number of Visits 12   PT Start Time [phone]   PT Stop Time 1031   PT Time Calculation (min) 45 min   Activity Tolerance Patient tolerated treatment well;No increased pain   Behavior During Therapy WFL for tasks assessed/performed      Past Medical History:  Diagnosis Date  . Concussion     Past Surgical History:  Procedure Laterality Date  . ROTATOR CUFF REPAIR      There were no vitals filed for this visit.       Subjective Assessment - 02/19/17 0950    Subjective Pt reports undergoing Rt knee arthroscopy about 4-6 weeeks ago, unable to recall date. No acute trauma, but reports gradual progression of pain and swelling over time. He was haivign constant sorness and swelling, with sleep disturbance.      Limitations Sitting   How long can you sit comfortably? not limited, but worsening stiffness thereafter   How long can you stand comfortably? 15-20 minutes (better with movement)    How long can you walk comfortably? 15-20 minutes of walking around the house    Patient Stated Goals decrease pain    Currently in Pain? Yes   Pain Score 3   worst 7/10; right knee, anteromedial adn anterolateral joint line   Pain Orientation Right   Pain Type Chronic pain;Surgical pain   Pain Onset More than a month ago   Pain Frequency Constant   Aggravating Factors  prolonged walking, lying in bed at night    Pain Relieving Factors ibuprophen, icing              [facility] PT Assessment - 02/19/17 0001      Assessment   Medical Diagnosis s/p Right knee arthroscopy    Referring Provider Frederico Hamman    Onset Date/Surgical Date --  August 20th, 2018 (estimated)    Hand Dominance Right   Next MD Visit October 2?, 2018    Prior Therapy none     Precautions   Precautions None     Restrictions   Weight Bearing Restrictions No     Balance Screen   Has the patient fallen in the past 6 months No   Has the patient had a decrease in activity level because of a fear of falling?  No   Is the patient reluctant to leave their home because of a fear of falling?  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   Vocation Requirements crouching, crawling, kneeling, squatting, lifting   Leisure hunting, fishing      ROM / Strength   AROM / PROM / Strength AROM;Strength     AROM   AROM Assessment  Site Knee   Right Knee Extension 6   Right Knee Flexion 119     Strength   Strength Assessment Site Hip;Knee   Right/Left Hip Right;Left   Right Hip Flexion 5/5   Right Hip Extension 4/5   Right Hip External Rotation  4/5  standing   Right Hip Internal Rotation 4+/5   Right Hip ABduction 4-/5  hor abdct: 5/5   Left Hip Flexion 5/5   Left Hip Extension 5/5   Left Hip External Rotation 5/5  standing    Left Hip Internal Rotation 5/5   Left Hip ABduction 4/5  hor abdct: 5/5   Right/Left Knee Right;Left   Right Knee Flexion 4/5   Right Knee Extension 5/5   Left Knee Flexion 5/5   Left Knee Extension 5/5     Transfers   Five time sit to stand comments  5xSTS: 17 seconds      Ambulation/Gait   Ambulation Distance (Feet) 450 Feet   Assistive device --  abducted RLE; apparent RLE dynamic varus instability (jeans    Gait velocity 1.58m/s     Standardized  Balance Assessment   Standardized Balance Assessment --  Will assess SLS next visit             Objective measurements completed on examination: See above findings.          [facility] Adult PT Treatment/Exercise - 02/19/17 0001      Exercises   Exercises Knee/Hip     Knee/Hip Exercises: Standing   Hip Abduction Right;1 set;10 reps;Knee straight     Knee/Hip Exercises: Seated   Long Arc Quad Right;1 set;10 reps  3sec H      Knee/Hip Exercises: Supine   Heel Slides 1 set;Right  3sec stretch x 3 minutes   Bridges Both;10 reps   Straight Leg Raises Right;1 set;15 reps  12" height                PT Education - 02/19/17 1046    Education provided Yes   Education Details Early focus on quads disinhibition and freqent joint mobility    Person(s) Educated Patient   Methods Explanation;Demonstration;Handout   Comprehension Verbalized understanding;Returned demonstration;Verbal cues required          PT Short Term Goals - 02/19/17 1057      PT SHORT TERM GOAL #1   Title After 3 weeks patient will demonstrate knee PROM 5-125 degrees.    Status New     PT SHORT TERM GOAL #2   Title After 3 weeks patient will demonstrate tolerance of 65ft AMB c average speed of 1.37m/s.    Status New     PT SHORT TERM GOAL #3   Title After 3 weeks patient will demonstrate 5xSTS <12 seconds.    Status New           PT Long Term Goals - 02/19/17 1059      PT LONG TERM GOAL #1   Title After 6 weeks patient will tolerate covering >1774ft <3/10 pain.    Status New     PT LONG TERM GOAL #2   Title After 6 weeks patient will demonstrate 5/5 strength in Rt hip extension (standing), Rt hip internal rotation, and right hip external rotation.    Status New     PT LONG TERM GOAL #3   Title After 6 weeks patient will demonstrate 5xSTS in <10 seconds.    Status New  Plan - 02/19/17 1050    Clinical Impression Statement Jocob Dambach is a 56yo  white male who presents about 1 month s/p right knee arthroscopy due to chronic degenerative changes. He c/o continued swelling, achiness, and functional limitations since surgery, similar to prior to surgery. Physical examination reveals focal weakness in varryign muscle groups in RLE, joint effusion with ROM limitations, limited activity tolerance, adn altered gait mechanics related to weakness and antalgia.    Clinical Presentation Stable   Clinical Presentation due to: typical post op presentation for this timeline.    Rehab Potential Good   PT Frequency 2x / week   PT Duration 6 weeks   PT Treatment/Interventions ADLs/Self Care Home Management;Dry needling;Patient/family education;Passive range of motion;Scar mobilization;Manual techniques;Balance training;Therapeutic exercise;Therapeutic activities;Functional mobility training;Gait training;Stair training;Moist Heat;Electrical Stimulation;Cryotherapy   PT Next Visit Plan review HEP and goals, varus/valgus instability testing, sensory TENS during exercises, SLS balance times    PT Home Exercise Plan heel slides x 3 minutes, SLR 1x15 BID, LAQ 1x10x3sH BID, bridging 1x10 BID, standing Rt abdct 10x BID.    Consulted and Agree with Plan of Care Patient      Patient will benefit from skilled therapeutic intervention in order to improve the following deficits and impairments:  Abnormal gait, Increased edema, Decreased scar mobility, Decreased knowledge of precautions, Decreased activity tolerance, Decreased strength, Pain, Decreased balance, Decreased mobility, Difficulty walking, Decreased range of motion  Visit Diagnosis: Acute pain of right knee - Plan: PT plan of care cert/re-cert  Difficulty in walking, not elsewhere classified - Plan: PT plan of care cert/re-cert  Muscle weakness (generalized) - Plan: PT plan of care cert/re-cert     Problem List Patient Active Problem List   Diagnosis Date Noted  . CONTUSION, RIGHT HAND 02/10/2009    11:03 AM, 02/19/17 Rosamaria Lints, PT, DPT Physical Therapist at [facility] [phone] (office)     Rosamaria Lints 02/19/2017, 11:02 AM  [facility] [address] [place], [place], [address] Phone: [phone]   Fax:  [phone]  Name: ERIQUE KASER MRN: [id number] Date of Birth: 04-Aug-1960

## 2017-02-21 ENCOUNTER — Encounter (HOSPITAL_COMMUNITY): Payer: Self-pay

## 2017-02-21 ENCOUNTER — Ambulatory Visit (HOSPITAL_COMMUNITY): Payer: Self-pay

## 2017-02-21 DIAGNOSIS — M6281 Muscle weakness (generalized): Secondary | ICD-10-CM

## 2017-02-21 DIAGNOSIS — M25561 Pain in right knee: Secondary | ICD-10-CM

## 2017-02-21 DIAGNOSIS — R262 Difficulty in walking, not elsewhere classified: Secondary | ICD-10-CM

## 2017-02-21 NOTE — Therapy (Signed)
[facility] [address] [place], [place], [address] Phone: [phone]   Fax:  [phone]  Physical Therapy Treatment  Patient Details  Name: Jonathan Woodard MRN: [id number] Date of Birth: 1960-09-11 Referring Provider: Frederico Hamman   Encounter Date: 02/21/2017      PT End of Session - 02/21/17 0817    Visit Number 2   Number of Visits 12   Date for PT Re-Evaluation 04/02/17   Authorization Type Pt is self pay, does not have insurance    Authorization - Visit Number 2   Authorization - Number of Visits 12   PT Start Time [phone]   PT Stop Time 0855   PT Time Calculation (min) 39 min   Activity Tolerance Patient tolerated treatment well;No increased pain   Behavior During Therapy WFL for tasks assessed/performed      Past Medical History:  Diagnosis Date  . Concussion     Past Surgical History:  Procedure Laterality Date  . ROTATOR CUFF REPAIR      There were no vitals filed for this visit.      Subjective Assessment - 02/21/17 0818    Subjective Pt states that his knee is getting better he reckons. He states that his knee pain is about like normal, a 2-3/10.    Limitations Sitting   How long can you sit comfortably? not limited, but worsening stiffness thereafter   How long can you stand comfortably? 15-20 minutes (better with movement)    How long can you walk comfortably? 15-20 minutes of walking around the house    Patient Stated Goals decrease pain    Currently in Pain? Yes   Pain Score 3    Pain Location Knee   Pain Orientation Right   Pain Descriptors / Indicators Sore   Pain Type Chronic pain;Surgical pain   Pain Onset More than a month ago   Pain Frequency Constant   Aggravating Factors  prolonged walking, lying in bed at night   Pain Relieving Factors ibuprofen, icing   Effect of Pain on Daily Activities slight increase            [facility] PT Assessment - 02/21/17 0001      Special Tests    Special  Tests Knee Special Tests   Knee Special tests  other;other2     other    Findings Negative   Side  Right   Comments valgus testing     other   findings Negative   Side Right   Comments varus testing; pt reported increased discomfort with teesting     Balance   Balance Assessed Yes     Static Standing Balance   Static Standing - Balance Support No upper extremity supported   Static Standing Balance -  Activities  Single Leg Stance - Right Leg;Single Leg Stance - Left Leg   Static Standing - Comment/# of Minutes 30 sec BLE firm, 15 sec or > BLE foam             [facility] Adult PT Treatment/Exercise - 02/21/17 0001      Knee/Hip Exercises: Standing   Knee Flexion Right;2 sets;10 reps   Knee Flexion Limitations 3#   Lateral Step Up Right;10 reps;Step Height: 4"   Forward Step Up Right;10 reps;Step Height: 4"   Rocker Board 2 minutes   Rocker Board Limitations R/L   SLS with Vectors x5RT BLE   Other Standing Knee Exercises sidestep with RTB 87ftx3RT  Knee/Hip Exercises: Seated   Sit to Sand 2 sets;10 reps;without UE support  4" step under LLE during 2nd set     Knee/Hip Exercises: Supine   Bridges Both;15 reps     Knee/Hip Exercises: Sidelying   Hip ABduction Right;2 sets;15 reps                PT Education - 02/21/17 0852    Education provided Yes   Education Details reviewed goals with no f/u questions; exercise technique, continue HEP   Person(s) Educated Patient   Methods Explanation;Demonstration;Handout   Comprehension Verbalized understanding;Returned demonstration          PT Short Term Goals - 02/19/17 1057      PT SHORT TERM GOAL #1   Title After 3 weeks patient will demonstrate knee PROM 5-125 degrees.    Status New     PT SHORT TERM GOAL #2   Title After 3 weeks patient will demonstrate tolerance of 616ft AMB c average speed of 1.24m/s.    Status New     PT SHORT TERM GOAL #3   Title After 3 weeks patient will demonstrate 5xSTS  <12 seconds.    Status New           PT Long Term Goals - 02/19/17 1059      PT LONG TERM GOAL #1   Title After 6 weeks patient will tolerate covering >1735ft <3/10 pain.    Status New     PT LONG TERM GOAL #2   Title After 6 weeks patient will demonstrate 5/5 strength in Rt hip extension (standing), Rt hip internal rotation, and right hip external rotation.    Status New     PT LONG TERM GOAL #3   Title After 6 weeks patient will demonstrate 5xSTS in <10 seconds.    Status New               Plan - 02/21/17 5409    Clinical Impression Statement Began session by reviewing goals with no f/u questions. Valgus/varus testing performed, both of which were negative but pt did have more discomfort with varus testing compared to valgus. Sensory TENS applied to R knee throughout entire session. Pt tolerated exercises well with no increases in pain. Session focused on gluteal strengthening and balance.   Rehab Potential Good   PT Frequency 2x / week   PT Duration 6 weeks   PT Treatment/Interventions ADLs/Self Care Home Management;Dry needling;Patient/family education;Passive range of motion;Scar mobilization;Manual techniques;Balance training;Therapeutic exercise;Therapeutic activities;Functional mobility training;Gait training;Stair training;Moist Heat;Electrical Stimulation;Cryotherapy   PT Next Visit Plan continue sensory TENS during exercises; BLE and functional strengthening, balance activities, gait training; manual PRN for pain and edema management   PT Home Exercise Plan heel slides x 3 minutes, SLR 1x15 BID, LAQ 1x10x3sH BID, bridging 1x10 BID, standing Rt abdct 10x BID.    Consulted and Agree with Plan of Care Patient      Patient will benefit from skilled therapeutic intervention in order to improve the following deficits and impairments:  Abnormal gait, Increased edema, Decreased scar mobility, Decreased knowledge of precautions, Decreased activity tolerance,  Decreased strength, Pain, Decreased balance, Decreased mobility, Difficulty walking, Decreased range of motion  Visit Diagnosis: Acute pain of right knee  Difficulty in walking, not elsewhere classified  Muscle weakness (generalized)     Problem List Patient Active Problem List   Diagnosis Date Noted  . CONTUSION, RIGHT HAND 02/10/2009       Jac Canavan PT, DPT  [facility] [address] [place], [place], [address] Phone: [phone]   Fax:  [phone]  Name: ALEPH NICKSON MRN: [id number] Date of Birth: 12/08/1960

## 2017-02-25 ENCOUNTER — Encounter (HOSPITAL_COMMUNITY): Payer: Self-pay | Admitting: Physical Therapy

## 2017-02-25 ENCOUNTER — Ambulatory Visit (HOSPITAL_COMMUNITY): Payer: Self-pay | Attending: Orthopedic Surgery | Admitting: Physical Therapy

## 2017-02-25 DIAGNOSIS — M6281 Muscle weakness (generalized): Secondary | ICD-10-CM | POA: Insufficient documentation

## 2017-02-25 DIAGNOSIS — R262 Difficulty in walking, not elsewhere classified: Secondary | ICD-10-CM | POA: Insufficient documentation

## 2017-02-25 DIAGNOSIS — M25561 Pain in right knee: Secondary | ICD-10-CM | POA: Insufficient documentation

## 2017-02-25 NOTE — Therapy (Signed)
[facility] [address] [place], [place], [address] Phone: [phone]   Fax:  [phone]  Physical Therapy Treatment  Patient Details  Name: Jonathan Woodard MRN: [id number] Date of Birth: Mar 23, 1961 Referring Provider: Frederico Hamman   Encounter Date: 02/25/2017      Woodard End of Session - 02/25/17 0856    Visit Number 3   Number of Visits 12   Date for Woodard Re-Evaluation 04/02/17   Authorization Type Woodard is self pay, does not have insurance    Authorization - Visit Number 3   Authorization - Number of Visits 12   Woodard Start Time [phone]   Woodard Stop Time 0856   Woodard Time Calculation (min) 40 min   Activity Tolerance Patient tolerated treatment well;No increased pain   Behavior During Therapy WFL for tasks assessed/performed      Past Medical History:  Diagnosis Date  . Concussion     Past Surgical History:  Procedure Laterality Date  . ROTATOR CUFF REPAIR      There were no vitals filed for this visit.      Subjective Assessment - 02/25/17 0818    Subjective Patient arrives he twisted his knee on Friday and heard a "pop"; his knee has been painful since but it has calmed down this morning    Patient Stated Goals decrease pain    Currently in Pain? Yes   Pain Score 2    Pain Location Knee   Pain Orientation Right                         [facility] Adult Woodard Treatment/Exercise - 02/25/17 0001      Knee/Hip Exercises: Standing   Heel Raises Both;1 set;15 reps   Heel Raises Limitations heel adn teo    Forward Lunges Both;1 set;10 reps   Forward Lunges Limitations 4 inch box    Lateral Step Up Both;1 set;15 reps   Lateral Step Up Limitations 4 inch box    Forward Step Up Both;1 set;15 reps   Forward Step Up Limitations 4 inch box    Step Down Both;1 set;10 reps   Step Down Limitations 4 inch box   U HHA    Functional Squat 1 set;10 reps   Functional Squat Limitations mod cues for form    Rocker Board 2 minutes    Rocker Board Limitations AP and lateral intermittent HHA      Knee/Hip Exercises: Supine   Quad Sets 1 set;10 reps   Quad Sets Limitations 5 second holds    Straight Leg Raises Right;1 set;15 reps     Knee/Hip Exercises: Sidelying   Hip ABduction Both;1 set;20 reps   Hip ABduction Limitations cues for form      Knee/Hip Exercises: Prone   Hip Extension Both;1 set;10 reps   Hip Extension Limitations cues for form              Balance Exercises - 02/25/17 0848      Balance Exercises: Standing   Tandem Stance Eyes open;3 reps;15 secs   SLS Eyes open;Solid surface;3 reps;15 secs   Tandem Gait Forward;Retro;4 reps  in // bars no HHA            Woodard Education - 02/25/17 0856    Education provided No          Woodard Short Term Goals - 02/19/17 1057      Woodard SHORT TERM GOAL #1  Title After 3 weeks patient will demonstrate knee PROM 5-125 degrees.    Status New     Woodard SHORT TERM GOAL #2   Title After 3 weeks patient will demonstrate tolerance of 660ft AMB c average speed of 1.59m/s.    Status New     Woodard SHORT TERM GOAL #3   Title After 3 weeks patient will demonstrate 5xSTS <12 seconds.    Status New           Woodard Long Term Goals - 02/19/17 1059      Woodard LONG TERM GOAL #1   Title After 6 weeks patient will tolerate covering >171ft <3/10 pain.    Status New     Woodard LONG TERM GOAL #2   Title After 6 weeks patient will demonstrate 5/5 strength in Rt hip extension (standing), Rt hip internal rotation, and right hip external rotation.    Status New     Woodard LONG TERM GOAL #3   Title After 6 weeks patient will demonstrate 5xSTS in <10 seconds.    Status New               Plan - 02/25/17 0856    Clinical Impression Statement Performed quick gross screen of R Knee due to patient reporting increased pain after twisting on the knee Friday; all tests appear generally the same as indicated in prior examination notes for this patient, no acute changes noted,  however patient does appear to have some mild edema on either side of the patella. Continued with general functional strengthening as well as balance training this session otherwise, patient appears to be tolerating Woodard well thus far.    Rehab Potential Good   Woodard Frequency 2x / week   Woodard Duration 6 weeks   Woodard Treatment/Interventions ADLs/Self Care Home Management;Dry needling;Patient/family education;Passive range of motion;Scar mobilization;Manual techniques;Balance training;Therapeutic exercise;Therapeutic activities;Functional mobility training;Gait training;Stair training;Moist Heat;Electrical Stimulation;Cryotherapy   Woodard Next Visit Plan continue sensory TENS during exercises; BLE and functional strengthening, balance activities, gait training; manual PRN for pain and edema management   Woodard Home Exercise Plan heel slides x 3 minutes, SLR 1x15 BID, LAQ 1x10x3sH BID, bridging 1x10 BID, standing Rt abdct 10x BID.    Consulted and Agree with Plan of Care Patient      Patient will benefit from skilled therapeutic intervention in order to improve the following deficits and impairments:  Abnormal gait, Increased edema, Decreased scar mobility, Decreased knowledge of precautions, Decreased activity tolerance, Decreased strength, Pain, Decreased balance, Decreased mobility, Difficulty walking, Decreased range of motion  Visit Diagnosis: Acute pain of right knee  Difficulty in walking, not elsewhere classified  Muscle weakness (generalized)     Problem List Patient Active Problem List   Diagnosis Date Noted  . CONTUSION, RIGHT HAND 02/10/2009    Jonathan Woodard, DPT [phone]  [facility] [address] [place], [place], [address] Phone: [phone]   Fax:  [phone]  Name: Jonathan Woodard MRN: [id number] Date of Birth: 1961-04-10

## 2017-02-27 ENCOUNTER — Ambulatory Visit (HOSPITAL_COMMUNITY): Payer: Self-pay

## 2017-02-27 ENCOUNTER — Encounter (HOSPITAL_COMMUNITY): Payer: Self-pay

## 2017-02-27 DIAGNOSIS — R262 Difficulty in walking, not elsewhere classified: Secondary | ICD-10-CM

## 2017-02-27 DIAGNOSIS — M25561 Pain in right knee: Secondary | ICD-10-CM

## 2017-02-27 DIAGNOSIS — M6281 Muscle weakness (generalized): Secondary | ICD-10-CM

## 2017-02-27 NOTE — Therapy (Signed)
[facility] [address] [place], [place], [address] Phone: [phone]   Fax:  [phone]  Physical Therapy Treatment  Patient Details  Name: Jonathan Woodard MRN: [id number] Date of Birth: July 15, 1960 Referring Provider: Frederico Hamman  Encounter Date: 02/27/2017      PT End of Session - 02/27/17 0956    Visit Number 4   Number of Visits 12   Date for PT Re-Evaluation 04/02/17   Authorization Type Pt is self pay, does not have insurance    Authorization - Visit Number 4   Authorization - Number of Visits 12   PT Start Time [id number]   PT Stop Time 1033   PT Time Calculation (min) 44 min   Activity Tolerance Patient tolerated treatment well;No increased pain   Behavior During Therapy WFL for tasks assessed/performed      Past Medical History:  Diagnosis Date  . Concussion     Past Surgical History:  Procedure Laterality Date  . ROTATOR CUFF REPAIR      There were no vitals filed for this visit.      Subjective Assessment - 02/27/17 0953    Subjective Pt reports knee is feeling good today, stated he has completed HEP and applied ice this morning prior therapy session.  Continues to c/o some painful popping of knee.     Patient Stated Goals decrease pain    Currently in Pain? Yes   Pain Score 2    Pain Location Knee   Pain Orientation Right   Pain Descriptors / Indicators Sore;Aching  toothachey   Pain Type Chronic pain;Surgical pain   Pain Onset More than a month ago   Pain Frequency Constant   Aggravating Factors  prolonged walking, lying in bed at night   Pain Relieving Factors ibuprofen, icing   Effect of Pain on Daily Activities slight increases            [facility] PT Assessment - 02/27/17 0001      Assessment   Medical Diagnosis s/p Right knee arthroscopy    Referring Provider Frederico Hamman   Onset Date/Surgical Date --  August 20th, 2018   Hand Dominance Right   Next MD Visit October 2?, 2018    Prior  Therapy none     Precautions   Precautions None                     [facility] Adult PT Treatment/Exercise - 02/27/17 0001      Knee/Hip Exercises: Standing   Heel Raises 20 reps   Heel Raises Limitations heel and toe no HHA   Lateral Step Up 1 set;15 reps;Hand Hold: 1;Step Height: 4";Right   Lateral Step Up Limitations 4 inch box    Forward Step Up Right;15 reps;Hand Hold: 1;Step Height: 6"   Forward Step Up Limitations 6in step   Step Down Right;15 reps;Hand Hold: 1;Step Height: 6"   Step Down Limitations 6in step   Functional Squat 15 reps   Functional Squat Limitations good form following cueing infront of chair   SLS 60" BLE   SLS with Vectors 5x 5" Rt   Other Standing Knee Exercises sidestep with RTB 8ftx3RT      Knee/Hip Exercises: Seated   Sit to Sand 10 reps;without UE support  low mat height      Knee/Hip Exercises: Supine   Quad Sets 1 set;10 reps   Quad Sets Limitations 5" holds   Straight Leg Raises Right;1 set;15 reps  Straight Leg Raises Limitations with quad set no extension lag   Knee Extension Limitations 1 degree lacking full extension   Knee Flexion Limitations 120 degrees flexion     Knee/Hip Exercises: Sidelying   Hip ABduction 20 reps   Hip ABduction Limitations cues for form      Knee/Hip Exercises: Prone   Hip Extension 20 reps             Balance Exercises - 02/27/17 1024      Balance Exercises: Standing   Tandem Stance Eyes open;Foam/compliant surface;3 reps             PT Short Term Goals - 02/19/17 1057      PT SHORT TERM GOAL #1   Title After 3 weeks patient will demonstrate knee PROM 5-125 degrees.    Status New     PT SHORT TERM GOAL #2   Title After 3 weeks patient will demonstrate tolerance of 67ft AMB c average speed of 1.52m/s.    Status New     PT SHORT TERM GOAL #3   Title After 3 weeks patient will demonstrate 5xSTS <12 seconds.    Status New           PT Long Term Goals - 02/19/17 1059       PT LONG TERM GOAL #1   Title After 6 weeks patient will tolerate covering >1742ft <3/10 pain.    Status New     PT LONG TERM GOAL #2   Title After 6 weeks patient will demonstrate 5/5 strength in Rt hip extension (standing), Rt hip internal rotation, and right hip external rotation.    Status New     PT LONG TERM GOAL #3   Title After 6 weeks patient will demonstrate 5xSTS in <10 seconds.    Status New               Plan - 02/27/17 1039    Clinical Impression Statement Pt progressing well towards goals.  Pt with improved AROM 1-120 degrees (was 6-119 initial eval) which assist with improve gait mechanics noted through session.  Progressed functional strengthening this session with increased height with stair training and reduced HHA to address stability with task.  No reports of increased pain through session. pt did c/o knee popping some with STS, not painful but was audible.     Rehab Potential Good   PT Frequency 2x / week   PT Duration 6 weeks   PT Treatment/Interventions ADLs/Self Care Home Management;Dry needling;Patient/family education;Passive range of motion;Scar mobilization;Manual techniques;Balance training;Therapeutic exercise;Therapeutic activities;Functional mobility training;Gait training;Stair training;Moist Heat;Electrical Stimulation;Cryotherapy   PT Next Visit Plan Add knee drives for flexion goal, time STS and begin stair training reciprocal pattern next session, progress functional strengthening, balance activities, gait training, manual PRN for pain and edema management.   PT Home Exercise Plan heel slides x 3 minutes, SLR 1x15 BID, LAQ 1x10x3sH BID, bridging 1x10 BID, standing Rt abdct 10x BID.       Patient will benefit from skilled therapeutic intervention in order to improve the following deficits and impairments:  Abnormal gait, Increased edema, Decreased scar mobility, Decreased knowledge of precautions, Decreased activity tolerance, Decreased  strength, Pain, Decreased balance, Decreased mobility, Difficulty walking, Decreased range of motion  Visit Diagnosis: Acute pain of right knee  Difficulty in walking, not elsewhere classified  Muscle weakness (generalized)     Problem List Patient Active Problem List   Diagnosis Date Noted  . CONTUSION, RIGHT HAND 02/10/2009  [address], LPTA; CBIS [phone]  Juel Burrow 02/27/2017, 10:46 AM  [facility] [address] [place], [place], [address] Phone: [phone]   Fax:  [phone]  Name: LENORD FRALIX MRN: [id number] Date of Birth: Feb 09, 1961

## 2017-03-04 ENCOUNTER — Encounter (HOSPITAL_COMMUNITY): Payer: Self-pay | Admitting: Physical Therapy

## 2017-03-04 ENCOUNTER — Ambulatory Visit (HOSPITAL_COMMUNITY): Payer: Self-pay | Admitting: Physical Therapy

## 2017-03-04 DIAGNOSIS — M6281 Muscle weakness (generalized): Secondary | ICD-10-CM

## 2017-03-04 DIAGNOSIS — M25561 Pain in right knee: Secondary | ICD-10-CM

## 2017-03-04 DIAGNOSIS — R262 Difficulty in walking, not elsewhere classified: Secondary | ICD-10-CM

## 2017-03-04 NOTE — Therapy (Signed)
[facility] [address] [place], [place], [address] Phone: [phone]   Fax:  [phone]  Physical Therapy Treatment  Patient Details  Name: Jonathan Woodard MRN: [id number] Date of Birth: 18-Mar-1961 Referring Provider: Frederico Hamman  Encounter Date: 03/04/2017      PT End of Session - 03/04/17 0857    Visit Number 5   Number of Visits 12   Date for PT Re-Evaluation 03/11/17   Authorization Type Pt is self pay, does not have insurance    Authorization - Visit Number 5   Authorization - Number of Visits 12   PT Start Time [phone]   PT Stop Time 0856   PT Time Calculation (min) 39 min   Activity Tolerance Patient tolerated treatment well;No increased pain   Behavior During Therapy WFL for tasks assessed/performed      Past Medical History:  Diagnosis Date  . Concussion     Past Surgical History:  Procedure Laterality Date  . ROTATOR CUFF REPAIR      There were no vitals filed for this visit.      Subjective Assessment - 03/04/17 0819    Subjective Patient arrives stating he had a good weekend, very little pain this morning. Standing back up from a stoop or a squat can still be painful. It is really coming along well.    Patient Stated Goals decrease pain    Currently in Pain? Yes   Pain Score 1    Pain Location Knee   Pain Orientation Right                         [facility] Adult PT Treatment/Exercise - 03/04/17 0001      Knee/Hip Exercises: Stretches   Active Hamstring Stretch Right;3 reps;30 seconds   Active Hamstring Stretch Limitations 12 inch box    Knee: Self-Stretch to increase Flexion 10 seconds   Knee: Self-Stretch Limitations x10   to 123-124 degrees today    Gastroc Stretch Both;3 reps;30 seconds   Gastroc Stretch Limitations slantboard      Knee/Hip Exercises: Standing   Heel Raises 20 reps   Heel Raises Limitations heel and toe no HHA   Forward Lunges Both;1 set;15 reps   Forward Lunges  Limitations 2 inch box    Lateral Step Up Both;1 set;15 reps   Lateral Step Up Limitations 6 inch box    Forward Step Up Both;1 set;15 reps   Forward Step Up Limitations 6 inch box    Step Down Both;1 set;20 reps   Step Down Limitations 6 inch box    Functional Squat 20 reps   Functional Squat Limitations cues for form     Knee/Hip Exercises: Seated   Sit to Sand 15 reps;without UE support  eccentric lower      Knee/Hip Exercises: Supine   Straight Leg Raises Right;1 set;20 reps   Straight Leg Raises Limitations 2#      Manual Therapy   Manual Therapy Edema management   Manual therapy comments separate from all other skilled services    Edema Management retromassage, R LE elevated                 PT Education - 03/04/17 0857    Education provided Yes   Education Details HEP update, early reasesss    Person(s) Educated Patient   Methods Explanation;Handout   Comprehension Verbalized understanding;Returned demonstration          PT  Short Term Goals - 02/19/17 1057      PT SHORT TERM GOAL #1   Title After 3 weeks patient will demonstrate knee PROM 5-125 degrees.    Status New     PT SHORT TERM GOAL #2   Title After 3 weeks patient will demonstrate tolerance of 67ft AMB c average speed of 1.67m/s.    Status New     PT SHORT TERM GOAL #3   Title After 3 weeks patient will demonstrate 5xSTS <12 seconds.    Status New           PT Long Term Goals - 02/19/17 1059      PT LONG TERM GOAL #1   Title After 6 weeks patient will tolerate covering >1784ft <3/10 pain.    Status New     PT LONG TERM GOAL #2   Title After 6 weeks patient will demonstrate 5/5 strength in Rt hip extension (standing), Rt hip internal rotation, and right hip external rotation.    Status New     PT LONG TERM GOAL #3   Title After 6 weeks patient will demonstrate 5xSTS in <10 seconds.    Status New               Plan - 03/04/17 4098    Clinical Impression Statement  Patient arrives doing well, his knee continues to feel better and better but coming up from a squat is still sore on the medial side. Continued focus on advancing functional strengthening today, including reciprocal stair training this session. He continues to do very well moving forward and could possibly be ready for early reassessment/DC if he continues to progress this well.    Rehab Potential Good   PT Frequency 2x / week   PT Duration 6 weeks   PT Treatment/Interventions ADLs/Self Care Home Management;Dry needling;Patient/family education;Passive range of motion;Scar mobilization;Manual techniques;Balance training;Therapeutic exercise;Therapeutic activities;Functional mobility training;Gait training;Stair training;Moist Heat;Electrical Stimulation;Cryotherapy   PT Next Visit Plan DC knee drives, ROM is at goal. Re-assess on 10/15, possible early DC. Otherwsie continue with progression of fucntional strength and stairs. Manual PRN.    PT Home Exercise Plan heel slides x 3 minutes, SLR 1x15 BID, LAQ 1x10x3sH BID, bridging 1x10 BID, standing Rt abdct 10x BID.  10/8- eccentric stand to sit    Consulted and Agree with Plan of Care Patient      Patient will benefit from skilled therapeutic intervention in order to improve the following deficits and impairments:  Abnormal gait, Increased edema, Decreased scar mobility, Decreased knowledge of precautions, Decreased activity tolerance, Decreased strength, Pain, Decreased balance, Decreased mobility, Difficulty walking, Decreased range of motion  Visit Diagnosis: Acute pain of right knee  Difficulty in walking, not elsewhere classified  Muscle weakness (generalized)     Problem List Patient Active Problem List   Diagnosis Date Noted  . CONTUSION, RIGHT HAND 02/10/2009    Nedra Hai PT, DPT [phone]  [facility] [address] [place], [place], [address] Phone: [phone]   Fax:   [phone]  Name: Jonathan Woodard MRN: [id number] Date of Birth: [birth year]

## 2017-03-04 NOTE — Patient Instructions (Signed)
   Stand to Sit [employer]  Start in standing near the front of the chair. Cross your arms over your chest or out of front of you (so they don't assist you). Very SLOWLY (should take you 4 seconds), start sitting. -Keep your bottom back -Don't let your knees travel past your toes -Keep knee's pointing forward (don't let touch) -Have your feet firmly planted in ground -I should be able to say, "Pause" at any point.  Repeat 15 times, 1-2 times per day.  

## 2017-03-06 ENCOUNTER — Ambulatory Visit (HOSPITAL_COMMUNITY): Payer: Self-pay

## 2017-03-06 ENCOUNTER — Encounter (HOSPITAL_COMMUNITY): Payer: Self-pay

## 2017-03-06 DIAGNOSIS — M6281 Muscle weakness (generalized): Secondary | ICD-10-CM

## 2017-03-06 DIAGNOSIS — R262 Difficulty in walking, not elsewhere classified: Secondary | ICD-10-CM

## 2017-03-06 DIAGNOSIS — M25561 Pain in right knee: Secondary | ICD-10-CM

## 2017-03-06 NOTE — Therapy (Signed)
[place], [place], [address] Phone: [phone]   Fax:  [phone]  Physical Therapy Treatment  Patient Details  Name: Jonathan Woodard MRN: [id number] Date of Birth: 1961/04/26 Referring Provider: Earlie Server  Encounter Date: 03/06/2017      PT End of Session - 03/06/17 0832    Visit Number 6   Number of Visits 12   Date for PT Re-Evaluation 03/11/17   Authorization Type Pt is self pay, does not have insurance    Authorization - Visit Number 6   Authorization - Number of Visits 12   PT Start Time [phone]   PT Stop Time 0901   PT Time Calculation (min) 38 min   Activity Tolerance Patient tolerated treatment well;No increased pain   Behavior During Therapy WFL for tasks assessed/performed      Past Medical History:  Diagnosis Date  . Concussion     Past Surgical History:  Procedure Laterality Date  . ROTATOR CUFF REPAIR      There were no vitals filed for this visit.      Subjective Assessment - 03/06/17 0828    Subjective Pt stated he is doing well this morning, feels the weather plays a big part with pain.     Patient Stated Goals decrease pain    Currently in Pain? Yes   Pain Score 1    Pain Location Knee   Pain Orientation Right   Pain Descriptors / Indicators Dull;Aching   Pain Type Surgical pain   Pain Onset More than a month ago   Pain Frequency Constant   Aggravating Factors  prolonged walking, lying in bed at night   Pain Relieving Factors ibuprofen, icing   Effect of Pain on Daily Activities slight increase                         [facility] Adult PT Treatment/Exercise - 03/06/17 0001      Knee/Hip Exercises: Standing   Heel Raises 20 reps   Heel Raises Limitations squat with ball to 12in step then heel raise with UE flexion   Forward Lunges Both;1 set;15 reps   Forward Lunges Limitations kneeling on BOSU   Lateral Step Up Both;20 reps;Step Height: 6"   Lateral  Step Up Limitations 6 inch box    Functional Squat 20 reps   Functional Squat Limitations squat with ball to 12in step then heel raise with UE flexion   Stairs 5RT on 7in step   SLS with Vectors 5x 10" BLE   Other Standing Knee Exercises sidestep with BTB 2 RT     Knee/Hip Exercises: Seated   Sit to Sand 15 reps;without UE support  eccentric control     Knee/Hip Exercises: Supine   Knee Extension Limitations 1 degree lacking full extension   Knee Flexion Limitations 123 degrees flexion                  PT Short Term Goals - 02/19/17 1057      PT SHORT TERM GOAL #1   Title After 3 weeks patient will demonstrate knee PROM 5-125 degrees.    Status New     PT SHORT TERM GOAL #2   Title After 3 weeks patient will demonstrate tolerance of 651f AMB c average speed of 1.155m.    Status New     PT SHORT TERM GOAL #3   Title After 3 weeks patient will demonstrate 5xSTS <  12 seconds.    Status New           PT Long Term Goals - 02/19/17 1059      PT LONG TERM GOAL #1   Title After 6 weeks patient will tolerate 6MWT covering >1740f <3/10 pain.    Status New     PT LONG TERM GOAL #2   Title After 6 weeks patient will demonstrate 5/5 strength in Rt hip extension (standing), Rt hip internal rotation, and right hip external rotation.    Status New     PT LONG TERM GOAL #3   Title After 6 weeks patient will demonstrate 5xSTS in <10 seconds.    Status New               Plan - 03/06/17 01587   Clinical Impression Statement Pt progressing well towards goals.  Increased focus on functional strengthening and knee stability.  Progressed to kneeling this session for RTW activities with minimal difficulty (on BOSU for support this session).  AROM 1-123 degrees.  No reports of increased pain through session.  Pt is approaching all goals met, anticipate early DC.     Rehab Potential Good   PT Frequency 2x / week   PT Duration 6 weeks   PT Treatment/Interventions  ADLs/Self Care Home Management;Dry needling;Patient/family education;Passive range of motion;Scar mobilization;Manual techniques;Balance training;Therapeutic exercise;Therapeutic activities;Functional mobility training;Gait training;Stair training;Moist Heat;Electrical Stimulation;Cryotherapy   PT Next Visit Plan DC knee drives, ROM is at goal. Re-assess on 10/15, possible early DC. Otherwsie continue with progression of fucntional strength and stairs. Begin kneeling on foam next session.  Manual PRN.    PT Home Exercise Plan heel slides x 3 minutes, SLR 1x15 BID, LAQ 1x10x3sH BID, bridging 1x10 BID, standing Rt abdct 10x BID.  10/8- eccentric stand to sit       Patient will benefit from skilled therapeutic intervention in order to improve the following deficits and impairments:  Abnormal gait, Increased edema, Decreased scar mobility, Decreased knowledge of precautions, Decreased activity tolerance, Decreased strength, Pain, Decreased balance, Decreased mobility, Difficulty walking, Decreased range of motion  Visit Diagnosis: Acute pain of right knee  Difficulty in walking, not elsewhere classified  Muscle weakness (generalized)     Problem List Patient Active Problem List   Diagnosis Date Noted  . CONTUSION, RIGHT HAND 02/10/2009   CIhor Austin LPort [place]ona Lento10/02/2017, 9:04 AM  [place] [address]Phone: [phone]  Fax:  [phone] Name: Jonathan WHITENIGHTMRN: [id number] of Birth: 416-Aug-1962

## 2017-03-11 ENCOUNTER — Ambulatory Visit (HOSPITAL_COMMUNITY): Payer: Self-pay | Admitting: Physical Therapy

## 2017-03-11 ENCOUNTER — Encounter (HOSPITAL_COMMUNITY): Payer: Self-pay | Admitting: Physical Therapy

## 2017-03-11 DIAGNOSIS — M6281 Muscle weakness (generalized): Secondary | ICD-10-CM

## 2017-03-11 DIAGNOSIS — M25561 Pain in right knee: Secondary | ICD-10-CM

## 2017-03-11 DIAGNOSIS — R262 Difficulty in walking, not elsewhere classified: Secondary | ICD-10-CM

## 2017-03-11 NOTE — Patient Instructions (Signed)
   FORWARD LUNGE  Start by standing with feet shoulder-width-apart. Next, take a step forward and slightly out to the side and allow your front knee to bend. Your back knee may bend as well. Then, return to original position, or you may walk and take a step forward and repeat with the other leg.  Keep your pelvis level and straight the entire time.   Your front knee should bend in line with the 2nd toe and not pass the front of the foot.  Repeat 15 times each leg, 1-2 times per day.     Lateral Lunge  Step out to the side in a lunge.  Keep knee over the foot and sit back to avoid knee passing in front of toes.  Repeat 15 times both sides, 1-2 times per day.    Hip Hike  Standing with both feet on the ground. Pull your hip up towards your ribs while keeping the knee straight. Place hands on hips (not seen). Don't lean with your body.  Repeat 15 times each side, 1-2 times per day.      SQUAT WITH HIP HINGE - HIP AND BACK DISASSOCIATION DRILL  When squatting, bend over at the waist, tighten your stomach muscles by drawing in your navel and keep your back straight while bending at your hips. This will protect your back from excessive loads.   Your buttock should lower behind your feet as if you are going to sit on a seat. Emphasize your weight going through your heels.   Also, for good knee alignment, do not let your knees pass in front of your toes and keep your knee in line with your 2nd toe (next to the big toe) as it bends.  Repeat 15 times, 1-2 times per day.

## 2017-03-11 NOTE — Therapy (Signed)
[facility] [place], [place], [address] Phone: [phone]   Fax:  [phone]  Physical Therapy Treatment (Discharge)  Patient Details  Name: Jonathan Woodard MRN: [id number] Date of Birth: Dec 27, 1960 Referring Provider: Earlie Server  Encounter Date: 03/11/2017   PHYSICAL THERAPY DISCHARGE SUMMARY  Visits from Start of Care: 7  Current functional level related to goals / functional outcomes: Patient has done very well and has met all goals at this point. See below for details. DC today.    Remaining deficits: See below    Education / Equipment: See below  Plan: Patient agrees to discharge.  Patient goals were met. Patient is being discharged due to meeting the stated rehab goals.  ?????           PT End of Session - 03/11/17 0853    Visit Number 7   Number of Visits 7   Date for PT Re-Evaluation 03/11/17   Authorization Type Pt is self pay, does not have insurance    Authorization - Visit Number 7   Authorization - Number of Visits 12   PT Start Time 0818   PT Stop Time 5852  DC tdoay, no further need for skilled PT services    PT Time Calculation (min) 32 min   Activity Tolerance Patient tolerated treatment well   Behavior During Therapy WFL for tasks assessed/performed      Past Medical History:  Diagnosis Date  . Concussion     Past Surgical History:  Procedure Laterality Date  . ROTATOR CUFF REPAIR      There were no vitals filed for this visit.      Subjective Assessment - 03/11/17 0820    Subjective Patient arrives stating he is able to do everything he needs and wants, it will hurt by the end of the day but it is very manageable, much better than when he came. He rates himself as being 85-90/100 right now, with main remaining concern being some pain with repetitious activity.    How long can you sit comfortably? 10/15- unlimited but can still get stiff    How long can you stand  comfortably? 10/15- fairly unlimited    How long can you walk comfortably? 10/15- no problems he has noticed   Patient Stated Goals decrease pain    Currently in Pain? No/denies            [facility] PT Assessment - 03/11/17 0001      AROM   Right Knee Extension -4   Right Knee Flexion 123     Strength   Right Hip Flexion 5/5   Right Hip Extension 4+/5   Right Hip ABduction 5/5   Left Hip Flexion 5/5   Left Hip Extension 5/5   Left Hip ABduction 5/5   Right Knee Flexion 5/5   Right Knee Extension 5/5   Left Knee Flexion 5/5   Left Knee Extension 5/5     Transfers   Five time sit to stand comments  6 seconds      6 minute walk test results    Aerobic Endurance Distance Walked 828   Endurance additional comments 3MWT; 1.85ms      High Level Balance   High Level Balance Comments SLS 30 seconds B                      [facility] Adult PT Treatment/Exercise - 03/11/17 0001      Knee/Hip  Exercises: Standing   Forward Lunges Both;1 set;15 reps   Forward Lunges Limitations floor    Side Lunges Both;1 set;15 reps   Side Lunges Limitations floor    Functional Squat 15 reps   Functional Squat Limitations cues for form    Other Standing Knee Exercises hip hikes 1x15 B                 PT Education - 03/11/17 0853    Education provided Yes   Education Details exam fidnings/progress towards goals, HEP updates, DC today    Person(s) Educated Patient   Methods Explanation;Handout   Comprehension Verbalized understanding;Returned demonstration          PT Short Term Goals - 03/11/17 0834      PT SHORT TERM GOAL #1   Title After 3 weeks patient will demonstrate knee PROM 5-125 degrees.    Baseline 10/15- -4-123   Period Weeks   Status Achieved     PT SHORT TERM GOAL #2   Title After 3 weeks patient will demonstrate tolerance of 671f AMB c average speed of 1.1939m.    Baseline 10/15- 1.39m39m   Period Weeks   Status Achieved     PT SHORT TERM GOAL #3    Title After 3 weeks patient will demonstrate 5xSTS <12 seconds.    Baseline 10/15- 6 seconds    Status Achieved           PT Long Term Goals - 03/11/17 0834      PT LONG TERM GOAL #1   Title After 6 weeks patient will tolerate 6MWT covering >1700f67f/10 pain.    Baseline 10/15- DNT 6MWT however 3MWT indicates this would be met    Period Weeks   Status Achieved     PT LONG TERM GOAL #2   Title After 6 weeks patient will demonstrate 5/5 strength in Rt hip extension (standing), Rt hip internal rotation, and right hip external rotation.    Baseline 10/15- hip rotators remain slightly weak, hip extensors 4+/5 at best   Period Weeks   Status Partially Met     PT LONG TERM GOAL #3   Title After 6 weeks patient will demonstrate 5xSTS in <10 seconds.    Baseline 10/15- 30 seconds B    Period Weeks   Status Achieved               Plan - 03/11/17 0854    Clinical Impression Statement Re-assessment performed today. Patient has made excellent objective improvement towards all goals at this point, reports he is 90% with only remaining concern being mild pain at the end of the day after repetitive activities. Updated HEP and advised patient to continue with icing program moving forward. DC today due to high level of function.    Rehab Potential Good   PT Next Visit Plan DC today    PT Home Exercise Plan heel slides x 3 minutes, SLR 1x15 BID, LAQ 1x10x3sH BID, bridging 1x10 BID, standing Rt abdct 10x BID.  10/8- eccentric stand to sit 10/15- forward and lateral lunges to floor, hip hikes, squats    Consulted and Agree with Plan of Care Patient      Patient will benefit from skilled therapeutic intervention in order to improve the following deficits and impairments:  Abnormal gait, Increased edema, Decreased scar mobility, Decreased knowledge of precautions, Decreased activity tolerance, Decreased strength, Pain, Decreased balance, Decreased mobility, Difficulty walking, Decreased  range of motion  Visit Diagnosis: Acute  pain of right knee  Difficulty in walking, not elsewhere classified  Muscle weakness (generalized)     Problem List Patient Active Problem List   Diagnosis Date Noted  . CONTUSION, RIGHT HAND 02/10/2009    Deniece Ree PT, DPT [place] [address] [place], [place], [address] Phone: [phone]   Fax:  [phone]  Name: BYRANT VALENT MRN: [id number] Date of Birth: 1960-07-07

## 2017-03-13 ENCOUNTER — Encounter (HOSPITAL_COMMUNITY): Payer: Self-pay

## 2017-03-18 ENCOUNTER — Encounter (HOSPITAL_COMMUNITY): Payer: Self-pay | Admitting: Physical Therapy

## 2017-03-20 ENCOUNTER — Encounter (HOSPITAL_COMMUNITY): Payer: Self-pay | Admitting: Physical Therapy

## 2017-03-25 ENCOUNTER — Encounter (HOSPITAL_COMMUNITY): Payer: Self-pay | Admitting: Physical Therapy

## 2017-03-27 ENCOUNTER — Encounter (HOSPITAL_COMMUNITY): Payer: Self-pay | Admitting: Physical Therapy

## 2019-02-04 ENCOUNTER — Other Ambulatory Visit: Payer: Self-pay

## 2019-02-04 ENCOUNTER — Encounter (HOSPITAL_COMMUNITY): Payer: Self-pay

## 2019-02-04 ENCOUNTER — Emergency Department (HOSPITAL_COMMUNITY)
Admission: EM | Admit: 2019-02-04 | Discharge: 2019-02-04 | Disposition: A | Discharge status: Home / Self Care | Payer: Self-pay | Attending: Emergency Medicine | Admitting: Emergency Medicine

## 2019-02-04 DIAGNOSIS — Z209 Contact with and (suspected) exposure to unspecified communicable disease: Secondary | ICD-10-CM

## 2019-02-04 DIAGNOSIS — Z203 Contact with and (suspected) exposure to rabies: Secondary | ICD-10-CM | POA: Insufficient documentation

## 2019-02-04 DIAGNOSIS — Z87891 Personal history of nicotine dependence: Secondary | ICD-10-CM | POA: Insufficient documentation

## 2019-02-04 DIAGNOSIS — Z23 Encounter for immunization: Secondary | ICD-10-CM | POA: Insufficient documentation

## 2019-02-04 MED ORDER — RABIES VACCINE, PCEC IM SUSR
1.0000 mL | Freq: Once | INTRAMUSCULAR | Status: AC
  Administered 2019-02-04: 08:00:00 1 mL via INTRAMUSCULAR
  Filled 2019-02-04: qty 1

## 2019-02-04 NOTE — ED Triage Notes (Signed)
Pt hat a bat in his home last TUesday.  Reports caught the bat in a box but didn't touch the bat.

## 2019-02-04 NOTE — ED Provider Notes (Signed)
Emergency Department Provider Note   I have reviewed the triage vital signs and the nursing notes.   HISTORY  Chief Complaint exposure to bat   HPI Jonathan Woodard is a 58 y.o. male presents to the emergency department for evaluation after finding a bat in his home several days ago.  The bat was found in the room where he and his wife sleep and was ill-appearing and on the floor.  He did trap the bat in a box and then release it at work when he saw it was still somewhat alive.  He did not sustain any known bites.  Animal control is not involved.  After doing some additional research he decided to present to the emergency department for rabies vaccines.  Patient is not having any symptoms.    Past Medical History:  Diagnosis Date  . Concussion     Patient Active Problem List   Diagnosis Date Noted  . CONTUSION, RIGHT HAND 02/10/2009    Past Surgical History:  Procedure Laterality Date  . knee pain    . ROTATOR CUFF REPAIR      Allergies Dilaudid [hydromorphone hcl]  No family history on file.  Social History Social History   Tobacco Use  . Smoking status: Former Games developermoker  . Smokeless tobacco: Never Used  Substance Use Topics  . Alcohol use: No  . Drug use: No    Review of Systems  Constitutional: No fever/chills Eyes: No visual changes. ENT: No sore throat. Cardiovascular: Denies chest pain. Respiratory: Denies shortness of breath. Gastrointestinal: No abdominal pain.  No nausea, no vomiting.  No diarrhea.  No constipation. Genitourinary: Negative for dysuria. Musculoskeletal: Negative for back pain. Skin: Negative for rash. Neurological: Negative for headaches, focal weakness or numbness.  10-point ROS otherwise negative.  ____________________________________________   PHYSICAL EXAM:  VITAL SIGNS: ED Triage Vitals  Enc Vitals Group     BP 02/04/19 0713 (!) 143/99     Pulse Rate 02/04/19 0713 83     Resp 02/04/19 0713 18     Temp 02/04/19 0713  98.5 F (36.9 C)     Temp Source 02/04/19 0713 Oral     SpO2 02/04/19 0713 96 %     Weight 02/04/19 0711 230 lb (104.3 kg)     Height 02/04/19 0711 5\' 11"  (1.803 m)   Constitutional: Alert and oriented. Well appearing and in no acute distress. Eyes: Conjunctivae are normal. Head: Atraumatic. Nose: No congestion/rhinnorhea. Mouth/Throat: Mucous membranes are moist. Neck: No stridor.  Cardiovascular: Good peripheral circulation.  Respiratory: Normal respiratory effort.  Gastrointestinal: No distention.  Musculoskeletal: No gross deformities of extremities. Neurologic:  Normal speech and language.  Skin:  Skin is warm, dry and intact. No rash noted.  ____________________________________________   PROCEDURES  Procedure(s) performed:   Procedures  None  ____________________________________________   INITIAL IMPRESSION / ASSESSMENT AND PLAN / ED COURSE  Pertinent labs & imaging results that were available during my care of the patient were reviewed by me and considered in my medical decision making (see chart for details).   Patient presents to the emergency department for evaluation after exposure to a bat at home.  Unknown how long the bat had been in the house.  No known bites.  Patient was given first rabies vaccine her and referred to local urgent care to complete the series.  Orders were placed and information provided at discharge regarding follow-up.   ____________________________________________  FINAL CLINICAL IMPRESSION(S) / ED DIAGNOSES  Final diagnoses:  Exposure to bat without known bite    MEDICATIONS GIVEN DURING THIS VISIT:  Medications  rabies vaccine (RABAVERT) injection 1 mL (has no administration in time range)    Note:  This document was prepared using Dragon voice recognition software and may include unintentional dictation errors.  Nanda Quinton, MD Emergency Medicine    Long, [facility], MD 02/04/19 [phone]

## 2019-02-04 NOTE — Discharge Instructions (Signed)
°                                  RABIES VACCINE FOLLOW UP  Patient's Name: Jonathan Woodard                     Original Order Date:02/04/2019  Medical Record Number: [id number]  ED Physician: Margette Fast, MD Primary Diagnosis: Rabies Exposure       PCP: Celene Squibb, MD  Patient Phone Number: (home) [phone] (home)    (cell)  Telephone Information:  Mobile [phone]    (work) There is no work phone number on file. Species of Animal:     You have been seen in the [facility] for a possible rabies exposure. It's very important you return for the additional vaccine doses.  Please call the clinic listed below for hours of operation.   Clinic that will administer your rabies vaccines:    DAY 0:  ED administered  DAY 3:  02/07/2019       DAY 7:  02/11/2019     DAY 14:  02/18/2019         The 5th vaccine injection is considered for immune compromised patients only.  DAY 28:  03/04/2019

## 2019-02-07 ENCOUNTER — Ambulatory Visit (HOSPITAL_COMMUNITY)
Admission: EM | Admit: 2019-02-07 | Discharge: 2019-02-07 | Disposition: A | Discharge status: Home / Self Care | Payer: Self-pay | Attending: Family Medicine | Admitting: Family Medicine

## 2019-02-07 ENCOUNTER — Other Ambulatory Visit: Payer: Self-pay

## 2019-02-07 DIAGNOSIS — Z203 Contact with and (suspected) exposure to rabies: Secondary | ICD-10-CM

## 2019-02-07 DIAGNOSIS — Z23 Encounter for immunization: Secondary | ICD-10-CM

## 2019-02-07 MED ORDER — RABIES VACCINE, PCEC IM SUSR
INTRAMUSCULAR | Status: AC
  Filled 2019-02-07: qty 1

## 2019-02-07 MED ORDER — RABIES VACCINE, PCEC IM SUSR
1.0000 mL | Freq: Once | INTRAMUSCULAR | Status: AC
  Administered 2019-02-07: 1 mL via INTRAMUSCULAR

## 2019-02-07 NOTE — ED Triage Notes (Signed)
Pt presents for second rabies vaccine.  

## 2019-02-11 ENCOUNTER — Ambulatory Visit
Admission: EM | Admit: 2019-02-11 | Discharge: 2019-02-11 | Disposition: A | Discharge status: Home / Self Care | Payer: Self-pay | Attending: Emergency Medicine | Admitting: Emergency Medicine

## 2019-02-11 DIAGNOSIS — Z23 Encounter for immunization: Secondary | ICD-10-CM

## 2019-02-11 DIAGNOSIS — Z203 Contact with and (suspected) exposure to rabies: Secondary | ICD-10-CM

## 2019-02-11 MED ORDER — RABIES VACCINE, PCEC IM SUSR: 1.0000 mL | Freq: Once | INTRAMUSCULAR | Status: DC

## 2019-02-11 NOTE — ED Triage Notes (Signed)
Pt presents for day 7 rabies vaccine. Denies any other complaints.

## 2019-02-18 ENCOUNTER — Ambulatory Visit
Admission: EM | Admit: 2019-02-18 | Discharge: 2019-02-18 | Disposition: A | Discharge status: Home / Self Care | Payer: Self-pay | Attending: Emergency Medicine | Admitting: Emergency Medicine

## 2019-02-18 DIAGNOSIS — Z23 Encounter for immunization: Secondary | ICD-10-CM

## 2019-02-18 DIAGNOSIS — Z203 Contact with and (suspected) exposure to rabies: Secondary | ICD-10-CM

## 2019-02-18 MED ORDER — RABIES VACCINE, PCEC IM SUSR
1.0000 mL | Freq: Once | INTRAMUSCULAR | Status: AC
  Administered 2019-02-18: 1 mL via INTRAMUSCULAR

## 2019-02-18 NOTE — ED Notes (Signed)
Pt presents for day 14 rabies

## 2019-05-25 ENCOUNTER — Emergency Department (HOSPITAL_COMMUNITY)
Admission: EM | Admit: 2019-05-25 | Discharge: 2019-05-26 | Disposition: A | Discharge status: Home / Self Care | Payer: HRSA Program | Attending: Emergency Medicine | Admitting: Emergency Medicine

## 2019-05-25 ENCOUNTER — Encounter (HOSPITAL_COMMUNITY): Payer: Self-pay | Admitting: Emergency Medicine

## 2019-05-25 ENCOUNTER — Emergency Department (HOSPITAL_COMMUNITY): Payer: HRSA Program

## 2019-05-25 ENCOUNTER — Other Ambulatory Visit: Payer: Self-pay

## 2019-05-25 DIAGNOSIS — Z87891 Personal history of nicotine dependence: Secondary | ICD-10-CM | POA: Diagnosis not present

## 2019-05-25 DIAGNOSIS — U071 COVID-19: Secondary | ICD-10-CM | POA: Diagnosis not present

## 2019-05-25 DIAGNOSIS — J1289 Other viral pneumonia: Secondary | ICD-10-CM | POA: Insufficient documentation

## 2019-05-25 DIAGNOSIS — R109 Unspecified abdominal pain: Secondary | ICD-10-CM | POA: Diagnosis present

## 2019-05-25 NOTE — ED Triage Notes (Signed)
Pt has been quarantined since 12/14 for covid. Pt states he went back to work today and is not much better. Pt c/o diarrhea, weakness, acid reflux, abd cramping, and cough.

## 2019-05-26 LAB — CBC WITH DIFFERENTIAL/PLATELET
Abs Immature Granulocytes: 0.07 10*3/uL (ref 0.00–0.07)
Basophils Absolute: 0.1 10*3/uL (ref 0.0–0.1)
Basophils Relative: 1 %
Eosinophils Absolute: 0.2 10*3/uL (ref 0.0–0.5)
Eosinophils Relative: 2 %
HCT: 42.2 % (ref 39.0–52.0)
Hemoglobin: 13.3 g/dL (ref 13.0–17.0)
Immature Granulocytes: 1 %
Lymphocytes Relative: 16 %
Lymphs Abs: 1.9 10*3/uL (ref 0.7–4.0)
MCH: 27.9 pg (ref 26.0–34.0)
MCHC: 31.5 g/dL (ref 30.0–36.0)
MCV: 88.5 fL (ref 80.0–100.0)
Monocytes Absolute: 0.8 10*3/uL (ref 0.1–1.0)
Monocytes Relative: 7 %
Neutro Abs: 8.5 10*3/uL — ABNORMAL HIGH (ref 1.7–7.7)
Neutrophils Relative %: 73 %
Platelets: 485 10*3/uL — ABNORMAL HIGH (ref 150–400)
RBC: 4.77 MIL/uL (ref 4.22–5.81)
RDW: 13.3 % (ref 11.5–15.5)
WBC: 11.5 10*3/uL — ABNORMAL HIGH (ref 4.0–10.5)
nRBC: 0 % (ref 0.0–0.2)

## 2019-05-26 LAB — BASIC METABOLIC PANEL
Anion gap: 10 (ref 5–15)
BUN: 15 mg/dL (ref 6–20)
CO2: 26 mmol/L (ref 22–32)
Calcium: 8.9 mg/dL (ref 8.9–10.3)
Chloride: 100 mmol/L (ref 98–111)
Creatinine, Ser: 0.82 mg/dL (ref 0.61–1.24)
GFR calc Af Amer: >60 mL/min (ref >60)
GFR calc non Af Amer: >60 mL/min (ref >60)
Glucose, Bld: 118 mg/dL — ABNORMAL HIGH (ref 70–99)
Potassium: 4 mmol/L (ref 3.5–5.1)
Sodium: 136 mmol/L (ref 135–145)

## 2019-05-26 LAB — URINALYSIS, ROUTINE W REFLEX MICROSCOPIC
Bilirubin Urine: NEGATIVE
Glucose, UA: NEGATIVE mg/dL
Hgb urine dipstick: NEGATIVE
Ketones, ur: NEGATIVE mg/dL
Leukocytes,Ua: NEGATIVE
Nitrite: NEGATIVE
Protein, ur: NEGATIVE mg/dL
Specific Gravity, Urine: 1.006 (ref 1.005–1.030)
pH: 5 (ref 5.0–8.0)

## 2019-05-26 MED ORDER — AZITHROMYCIN 250 MG PO TABS: ORAL_TABLET | ORAL | 0 refills | Status: DC

## 2019-05-26 MED ORDER — AZITHROMYCIN 250 MG PO TABS
500.0000 mg | ORAL_TABLET | Freq: Once | ORAL | Status: AC
  Administered 2019-05-26: 500 mg via ORAL
  Filled 2019-05-26: qty 2

## 2019-05-26 NOTE — ED Provider Notes (Addendum)
[facility] EMERGENCY DEPARTMENT Provider Note   CSN: [id number] Arrival date & time: 05/25/19  1847     History Chief Complaint  Patient presents with  . Abdominal Pain    Jonathan Woodard is a 58 y.o. male who initially developed flu like symptoms around December 6th, describing rhinorrhea and congestion,  generalized body aches, dry cough, low grade fever and diarrhea and generalized abdominal cramping.  He was seen by his pcp on December 14th at which time he tested positive for Covid 19. 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 He denies any persistent fevers.    HPI     Past Medical History:  Diagnosis Date  . Concussion     Patient Active Problem List   Diagnosis Date Noted  . CONTUSION, RIGHT HAND 02/10/2009    Past Surgical History:  Procedure Laterality Date  . knee pain    . ROTATOR CUFF REPAIR         No family history on file.  Social History   Tobacco Use  . Smoking status: Former Research scientist (life sciences)  . Smokeless tobacco: Never Used  Substance Use Topics  . Alcohol use: No  . Drug use: No    Home Medications Prior to Admission medications   Medication Sig Start Date End Date Taking? Authorizing Provider  azithromycin (ZITHROMAX Z-PAK) 250 MG tablet Take one tablet daily for 4 additional days. 05/26/19   Evalee Jefferson, PA-C    Allergies    Dilaudid [hydromorphone hcl]  Review of Systems   Review of Systems  Constitutional: Positive for fatigue. Negative for fever.  HENT: Positive for rhinorrhea. Negative for congestion and sore throat.   Eyes: Negative.   Respiratory: Positive for cough. Negative for chest tightness and shortness of breath.   Cardiovascular: Negative for chest pain.  Gastrointestinal: Positive for diarrhea and nausea. Negative for  abdominal pain.  Genitourinary: Negative.  Negative for dysuria.  Musculoskeletal: Positive for myalgias. Negative for arthralgias, joint swelling and neck pain.  Skin: Negative.  Negative for rash and wound.  Neurological: Positive for weakness. Negative for dizziness, light-headedness, numbness and headaches.  Psychiatric/Behavioral: Negative.     Physical Exam Updated Vital Signs BP 133/75 (BP Location: Right Arm)   Pulse (!) 105   Temp 98.2 F (36.8 C) (Oral)   Resp 18   Ht 5\' 11"  (1.803 m)   Wt 104.3 kg   SpO2 97%   BMI 32.08 kg/m   Physical Exam Vitals and nursing note reviewed.  Constitutional:      Appearance: He is well-developed.  HENT:     Head: Normocephalic and atraumatic.  Eyes:     Conjunctiva/sclera: Conjunctivae normal.  Cardiovascular:     Rate and Rhythm: Normal rate and regular rhythm.     Heart sounds: Normal heart sounds.  Pulmonary:     Effort: Pulmonary effort is normal. No respiratory distress.     Breath sounds: Normal breath sounds. No wheezing, rhonchi or rales.  Abdominal:     General: Bowel sounds are normal.     Palpations: Abdomen is soft.     Tenderness: There is no abdominal tenderness.  Musculoskeletal:        General: Normal range of motion.     Cervical back: Normal range of motion.  Skin:    General: Skin  is warm and dry.  Neurological:     Mental Status: He is alert.     ED Results / Procedures / Treatments   Labs (all labs ordered are listed, but only abnormal results are displayed) Labs Reviewed  CBC WITH DIFFERENTIAL/PLATELET - Abnormal; Notable for the following components:      Result Value   WBC 11.5 (*)    Platelets 485 (*)    Neutro Abs 8.5 (*)    All other components within normal limits  BASIC METABOLIC PANEL - Abnormal; Notable for the following components:   Glucose, Bld 118 (*)    All other components within normal limits  URINALYSIS, ROUTINE W REFLEX MICROSCOPIC - Abnormal; Notable for the following  components:   Color, Urine STRAW (*)    All other components within normal limits    EKG None  Radiology DG Chest Portable 1 View  Result Date: 05/25/2019 CLINICAL DATA:  Cough EXAM: PORTABLE CHEST 1 VIEW COMPARISON:  02/18/2011 FINDINGS: There are diffuse bilateral pulmonary opacities bilaterally there is no pneumothorax. No large pleural effusion. The heart size is normal. There are old healed left-sided rib fractures. IMPRESSION: Diffuse bilateral pulmonary opacities concerning for multifocal pneumonia (viral or bacterial). Electronically Signed   By: Katherine Mantle M.D.   On: 05/25/2019 23:47    Procedures Procedures (including critical care time)  Medications Ordered in ED Medications  azithromycin (ZITHROMAX) tablet 500 mg (has no administration in time range)    ED Course  I have reviewed the triage vital signs and the nursing notes.  Pertinent labs & imaging results that were available during my care of the patient were reviewed by me and considered in my medical decision making (see chart for details).    MDM Rules/Calculators/A&P                     Pt with persistent weakness, fatigue, cough, Exam reassuring, labs and cxr reviewed and interpreted.  cxr suggesting pneumonia, possibly viral, but not classic covid pattern.  Will cover with antibiotics, zithromax tarted here.  Encouraged continued rest, fluids, recheck for any new or worsened sx.  Denies sob, no hypoxia while here.  No CP, no pleuritic sx, no sx  suggesting PE.   Jonathan Woodard was evaluated in Emergency Department on 05/26/2019 for the symptoms described in the history of present illness. He was evaluated in the context of the global COVID-19 pandemic, which necessitated consideration that the patient might be at risk for infection with the SARS-CoV-2 virus that causes COVID-19. Institutional protocols and algorithms that pertain to the evaluation of patients at risk for COVID-19 are in a state of rapid  change based on information released by regulatory bodies including the CDC and federal and state organizations. These policies and algorithms were followed during the patient's care in the ED.    Final Clinical Impression(s) / ED Diagnoses Final diagnoses:  COVID-19  Pneumonia due to COVID-19 virus    Rx / DC Orders ED Discharge Orders         Ordered    azithromycin (ZITHROMAX Z-PAK) 250 MG tablet     05/26/19 0147           Burgess Amor, PA-C 05/26/19 0153    Burgess Amor, PA-C 05/26/19 0154    Dione Booze, MD 05/26/19 [phone]

## 2019-05-26 NOTE — Discharge Instructions (Signed)
Your lab tests are reassuring tonight.  Your chest xray suggests a pneumonia which is probably viral associated with your known Covid in fection. However, given the length of time you have been sick, we are covering your with antibiotics to help ensure you are not developing a secondary bacterial pneumonia. Continue to rest, increase fluid intake.  Covid can take weeks to get over completely. See your MD or recheck here for any worsened symptoms such as shortness of breath or return of your fevers.

## 2019-06-08 ENCOUNTER — Other Ambulatory Visit: Payer: Self-pay

## 2019-06-08 ENCOUNTER — Emergency Department (HOSPITAL_COMMUNITY): Payer: Self-pay

## 2019-06-08 ENCOUNTER — Encounter (HOSPITAL_COMMUNITY): Payer: Self-pay | Admitting: Emergency Medicine

## 2019-06-08 ENCOUNTER — Emergency Department (HOSPITAL_COMMUNITY)
Admission: EM | Admit: 2019-06-08 | Discharge: 2019-06-08 | Disposition: A | Discharge status: Home / Self Care | Payer: Self-pay | Attending: Emergency Medicine | Admitting: Emergency Medicine

## 2019-06-08 DIAGNOSIS — U071 COVID-19: Secondary | ICD-10-CM | POA: Insufficient documentation

## 2019-06-08 DIAGNOSIS — R5383 Other fatigue: Secondary | ICD-10-CM | POA: Insufficient documentation

## 2019-06-08 DIAGNOSIS — R531 Weakness: Secondary | ICD-10-CM | POA: Insufficient documentation

## 2019-06-08 DIAGNOSIS — Z8701 Personal history of pneumonia (recurrent): Secondary | ICD-10-CM | POA: Insufficient documentation

## 2019-06-08 DIAGNOSIS — R05 Cough: Secondary | ICD-10-CM | POA: Insufficient documentation

## 2019-06-08 DIAGNOSIS — B342 Coronavirus infection, unspecified: Secondary | ICD-10-CM

## 2019-06-08 LAB — CBC WITH DIFFERENTIAL/PLATELET
Abs Immature Granulocytes: 0.04 10*3/uL (ref 0.00–0.07)
Basophils Absolute: 0.1 10*3/uL (ref 0.0–0.1)
Basophils Relative: 1 %
Eosinophils Absolute: 0.3 10*3/uL (ref 0.0–0.5)
Eosinophils Relative: 4 %
HCT: 42.3 % (ref 39.0–52.0)
Hemoglobin: 13.5 g/dL (ref 13.0–17.0)
Immature Granulocytes: 1 %
Lymphocytes Relative: 16 %
Lymphs Abs: 1.4 10*3/uL (ref 0.7–4.0)
MCH: 27.7 pg (ref 26.0–34.0)
MCHC: 31.9 g/dL (ref 30.0–36.0)
MCV: 86.9 fL (ref 80.0–100.0)
Monocytes Absolute: 0.6 10*3/uL (ref 0.1–1.0)
Monocytes Relative: 7 %
Neutro Abs: 6.2 10*3/uL (ref 1.7–7.7)
Neutrophils Relative %: 71 %
Platelets: 300 10*3/uL (ref 150–400)
RBC: 4.87 MIL/uL (ref 4.22–5.81)
RDW: 14 % (ref 11.5–15.5)
WBC: 8.6 10*3/uL (ref 4.0–10.5)
nRBC: 0 % (ref 0.0–0.2)

## 2019-06-08 LAB — COMPREHENSIVE METABOLIC PANEL
ALT: 37 U/L (ref 0–44)
AST: 25 U/L (ref 15–41)
Albumin: 3.7 g/dL (ref 3.5–5.0)
Alkaline Phosphatase: 65 U/L (ref 38–126)
Anion gap: 9 (ref 5–15)
BUN: 12 mg/dL (ref 6–20)
CO2: 26 mmol/L (ref 22–32)
Calcium: 9 mg/dL (ref 8.9–10.3)
Chloride: 102 mmol/L (ref 98–111)
Creatinine, Ser: 0.77 mg/dL (ref 0.61–1.24)
GFR calc Af Amer: >60 mL/min (ref >60)
GFR calc non Af Amer: >60 mL/min (ref >60)
Glucose, Bld: 183 mg/dL — ABNORMAL HIGH (ref 70–99)
Potassium: 3.8 mmol/L (ref 3.5–5.1)
Sodium: 137 mmol/L (ref 135–145)
Total Bilirubin: 0.5 mg/dL (ref 0.3–1.2)
Total Protein: 6.8 g/dL (ref 6.5–8.1)

## 2019-06-08 NOTE — ED Notes (Signed)
Pt ambulated from waiting room without difficulty, sats 97-98 % on r/a

## 2019-06-08 NOTE — ED Provider Notes (Signed)
[facility] EMERGENCY DEPARTMENT Provider Note   CSN: [id number] Arrival date & time: 06/08/19  2595     History Chief Complaint  Patient presents with  . Shortness of Breath    Jonathan Woodard is a 59 y.o. male.  HPI   This patient is a very pleasant 58 year old male, he is otherwise healthy without any chronic lung or heart disease, was diagnosed with coronavirus infection approximately 1 month ago, subsequently developed pneumonia and was treated with Zithromax seen on 29 December.  He presents to the hospital today feeling like he is still short of breath when he lays down, still has an occasional cough and still struggles with fatigue.  He denies significant shortness of breath with exertion.  He has some loose stools but no watery or bloody diarrhea and is not having any nausea or vomiting.  His appetite's been okay.  He is just concerned because he is still fatigued.  He finished the medication, Zithromax.  I have personally reviewed the patient's prior medical record, the patient's prior x-ray showed diffuse bilateral pulmonary opacities concerning for multifocal pneumonia and in the presence of coronavirus likely the answer.  Past Medical History:  Diagnosis Date  . Concussion     Patient Active Problem List   Diagnosis Date Noted  . CONTUSION, RIGHT HAND 02/10/2009    Past Surgical History:  Procedure Laterality Date  . knee pain    . ROTATOR CUFF REPAIR         No family history on file.  Social History   Tobacco Use  . Smoking status: Former Games developer  . Smokeless tobacco: Never Used  Substance Use Topics  . Alcohol use: No  . Drug use: No    Home Medications Prior to Admission medications   Medication Sig Start Date End Date Taking? Authorizing Provider  ibuprofen (ADVIL) 200 MG tablet Take 200 mg by mouth every 6 (six) hours as needed.   Yes [provider]  azithromycin (ZITHROMAX Z-PAK) 250 MG tablet Take one tablet daily for 4 additional  days. Patient not taking: Reported on 06/08/2019 05/26/19   Burgess Amor, PA-C    Allergies    Dilaudid [hydromorphone hcl]  Review of Systems   Review of Systems  All other systems reviewed and are negative.   Physical Exam Updated Vital Signs BP (!) 148/92 (BP Location: Right Arm)   Pulse 99   Temp 98.2 F (36.8 C) (Oral)   Resp 18   Ht 1.803 m (5\' 11" )   Wt 104 kg   SpO2 98%   BMI 31.98 kg/m   Physical Exam Vitals and nursing note reviewed.  Constitutional:      General: He is not in acute distress.    Appearance: He is well-developed.  HENT:     Head: Normocephalic and atraumatic.     Mouth/Throat:     Pharynx: No oropharyngeal exudate.  Eyes:     General: No scleral icterus.       Right eye: No discharge.        Left eye: No discharge.     Conjunctiva/sclera: Conjunctivae normal.     Pupils: Pupils are equal, round, and reactive to light.  Neck:     Thyroid: No thyromegaly.     Vascular: No JVD.  Cardiovascular:     Rate and Rhythm: Normal rate and regular rhythm.     Heart sounds: Normal heart sounds. No murmur. No friction rub. No gallop.   Pulmonary:  Effort: Pulmonary effort is normal. No respiratory distress.     Breath sounds: Normal breath sounds. No wheezing or rales.  Abdominal:     General: Bowel sounds are normal. There is no distension.     Palpations: Abdomen is soft. There is no mass.     Tenderness: There is no abdominal tenderness.  Musculoskeletal:        General: No tenderness. Normal range of motion.     Cervical back: Normal range of motion and neck supple.  Lymphadenopathy:     Cervical: No cervical adenopathy.  Skin:    General: Skin is warm and dry.     Findings: No erythema or rash.  Neurological:     General: No focal deficit present.     Mental Status: He is alert.     Coordination: Coordination normal.  Psychiatric:        Behavior: Behavior normal.     ED Results / Procedures / Treatments   Labs (all labs  ordered are listed, but only abnormal results are displayed) Labs Reviewed  COMPREHENSIVE METABOLIC PANEL - Abnormal; Notable for the following components:      Result Value   Glucose, Bld 183 (*)    All other components within normal limits  CBC WITH DIFFERENTIAL/PLATELET    EKG None  Radiology DG Chest 2 View  Result Date: 06/08/2019 CLINICAL DATA:  Shortness of breath and nonproductive cough. Patient diagnosed with COVID-19 05/03/2019. EXAM: CHEST - 2 VIEW COMPARISON:  Single-view of the chest 05/25/2019. PA and lateral chest 02/18/2011. FINDINGS: Patchy opacities in the periphery of the right upper and middle lobes have improved since the most recent examination. The left lung is clear. Heart size is normal. Atherosclerosis is noted. No pneumothorax or pleural effusion. Remote left rib fractures noted. IMPRESSION: Improved patchy opacities in the periphery of right upper and middle lobes. No new abnormality. Electronically Signed   By: Drusilla Kanner M.D.   On: 06/08/2019 10:00    Procedures Procedures (including critical care time)  Medications Ordered in ED Medications - No data to display  ED Course  I have reviewed the triage vital signs and the nursing notes.  Pertinent labs & imaging results that were available during my care of the patient were reviewed by me and considered in my medical decision making (see chart for details).    MDM Rules/Calculators/A&P                      Mucous membranes are moist, lung sounds are totally clear, he is heart rate is between 95 and 102 and a normal sinus rhythm, there is no edema or JVD and he speaks in full sentences.  This could be residual from his Covid infection, will rule out worsening or persistent pneumonia which could indicate a more bacterial cause.  However this patient is afebrile, normotensive and without significant findings on exam of concern.  Chest x-ray and labs will be ordered  The patient has normal lab work, no  leukocytosis, normal renal function, normal electrolytes, slight hyperglycemia.  X-ray shows improving patchy infiltrates consistent with his prior Covid pneumonia.  I do not think patient needs any other medications or work-up at this time, he is stable for discharge and has been given reassurance about his generalized weakness.  Final Clinical Impression(s) / ED Diagnoses Final diagnoses:  Generalized weakness  Coronavirus infection    Rx / DC Orders ED Discharge Orders    None  Noemi Chapel, MD 06/08/19 1113

## 2019-06-08 NOTE — Discharge Instructions (Signed)
Your x-ray is improved, your vital signs are normal, your blood work is normal.  Please continue to drink plenty of fluids, this may take another couple weeks to totally get better, seek medical exam for any severe or worsening symptoms.

## 2019-06-08 NOTE — ED Triage Notes (Signed)
Shortness of breath that is worse when laying on back also reports cough.   Pt dx with covid 12/06.

## 2020-04-04 ENCOUNTER — Encounter (HOSPITAL_COMMUNITY): Payer: Self-pay | Admitting: Emergency Medicine

## 2020-04-04 ENCOUNTER — Emergency Department (HOSPITAL_COMMUNITY): Payer: Self-pay

## 2020-04-04 ENCOUNTER — Other Ambulatory Visit: Payer: Self-pay

## 2020-04-04 ENCOUNTER — Emergency Department (HOSPITAL_COMMUNITY)
Admission: EM | Admit: 2020-04-04 | Discharge: 2020-04-04 | Disposition: A | Discharge status: Home / Self Care | Payer: Self-pay | Attending: Emergency Medicine | Admitting: Emergency Medicine

## 2020-04-04 DIAGNOSIS — Z8616 Personal history of COVID-19: Secondary | ICD-10-CM | POA: Insufficient documentation

## 2020-04-04 DIAGNOSIS — J029 Acute pharyngitis, unspecified: Secondary | ICD-10-CM | POA: Insufficient documentation

## 2020-04-04 DIAGNOSIS — J3489 Other specified disorders of nose and nasal sinuses: Secondary | ICD-10-CM | POA: Insufficient documentation

## 2020-04-04 DIAGNOSIS — Z87891 Personal history of nicotine dependence: Secondary | ICD-10-CM | POA: Insufficient documentation

## 2020-04-04 DIAGNOSIS — R0982 Postnasal drip: Secondary | ICD-10-CM

## 2020-04-04 HISTORY — DX: COVID-19: U07.1

## 2020-04-04 LAB — GROUP A STREP BY PCR: Group A Strep by PCR: NOT DETECTED

## 2020-04-04 NOTE — ED Provider Notes (Signed)
[facility] EMERGENCY DEPARTMENT Provider Note   CSN: [id number] Arrival date & time: 04/04/20  8841   Time seen 5:45 AM  History Chief Complaint  Patient presents with  . Sore Throat    Jonathan Woodard is a 59 y.o. male.  HPI   Patient states he started getting a sore throat on November 4.  He denies fever but does describe myalgias and lack of energy.  He denies rhinorrhea but feels like he is having postnasal drip and he has to cough and clear his throat.  He states he has had a cough since January when he had Covid.  He continues to have a dry cough although he did cough up some white mucus recently.  He denies nausea, vomiting, diarrhea.  He denies having problems with sore throats in the past.  He has not been around anybody else who is sick.  He does not smoke.  PCP Benita Stabile, MD   Past Medical History:  Diagnosis Date  . Concussion   . COVID     Patient Active Problem List   Diagnosis Date Noted  . CONTUSION, RIGHT HAND 02/10/2009    Past Surgical History:  Procedure Laterality Date  . knee pain    . ROTATOR CUFF REPAIR         History reviewed. No pertinent family history.  Social History   Tobacco Use  . Smoking status: Former Games developer  . Smokeless tobacco: Never Used  Substance Use Topics  . Alcohol use: No  . Drug use: No    Home Medications Prior to Admission medications   Medication Sig Start Date End Date Taking? Authorizing Provider  azithromycin (ZITHROMAX Z-PAK) 250 MG tablet Take one tablet daily for 4 additional days. Patient not taking: Reported on 06/08/2019 05/26/19   Burgess Amor, PA-C  ibuprofen (ADVIL) 200 MG tablet Take 200 mg by mouth every 6 (six) hours as needed.    [provider]  No medications  Allergies    Dilaudid [hydromorphone hcl]  Review of Systems   Review of Systems  All other systems reviewed and are negative.   Physical Exam Updated Vital Signs BP (!) 146/90 (BP Location: Left Arm)   Pulse 89    Temp 98.2 F (36.8 C) (Oral)   Resp 20   Ht 5\' 11"  (1.803 m)   Wt 105.2 kg   SpO2 96%   BMI 32.36 kg/m   Physical Exam Vitals and nursing note reviewed.  Constitutional:      General: He is not in acute distress.    Appearance: Normal appearance. He is normal weight. He is not ill-appearing or toxic-appearing.  HENT:     Head: Normocephalic and atraumatic.     Right Ear: External ear normal.     Left Ear: External ear normal.     Nose: Nose normal.     Mouth/Throat:     Mouth: Mucous membranes are moist.     Pharynx: No oropharyngeal exudate or posterior oropharyngeal erythema.     Comments: Uvula is midline, voice is normal.  There may be some mild swelling of the soft palate Eyes:     Extraocular Movements: Extraocular movements intact.     Conjunctiva/sclera: Conjunctivae normal.     Pupils: Pupils are equal, round, and reactive to light.  Cardiovascular:     Rate and Rhythm: Normal rate and regular rhythm.     Pulses: Normal pulses.     Heart sounds: Normal heart sounds.  Pulmonary:  Effort: Pulmonary effort is normal. No respiratory distress.     Breath sounds: Normal breath sounds. No stridor. No wheezing, rhonchi or rales.  Musculoskeletal:        General: Normal range of motion.     Cervical back: Normal range of motion and neck supple.  Lymphadenopathy:     Cervical: No cervical adenopathy.  Skin:    General: Skin is warm and dry.  Neurological:     General: No focal deficit present.     Mental Status: He is alert and oriented to person, place, and time.     Cranial Nerves: No cranial nerve deficit.  Psychiatric:        Mood and Affect: Mood normal.        Behavior: Behavior normal.        Thought Content: Thought content normal.     ED Results / Procedures / Treatments   Labs (all labs ordered are listed, but only abnormal results are displayed)  Results for orders placed or performed during the hospital encounter of 04/04/20  Group A Strep by PCR    Specimen: Throat; Sterile Swab  Result Value Ref Range   Group A Strep by PCR NOT DETECTED NOT DETECTED   Laboratory interpretation all normal     EKG None  Radiology DG Chest Port 1 View  Result Date: 04/04/2020 CLINICAL DATA:  Sore throat and cough since Thursday EXAM: PORTABLE CHEST 1 VIEW COMPARISON:  06/08/2019 FINDINGS: Asymmetric density at the left upper lobe may reflect the first costochondral junction, but needs confirmation. There is no edema, air bronchogram, effusion, or pneumothorax. Normal heart size and mediastinal contours. Remote and healed left rib fractures. IMPRESSION: Nodular left suprahilar density is likely related to the left first costochondral junction, but needs confirmation. Recommend two-view chest x-ray (this study is rotated) versus chest CT follow-up. If there are pneumonia symptoms, this should be performed after treatment to allow for resolution of infectious infiltrates. Electronically Signed   By: Marnee Spring M.D.   On: 04/04/2020 06:09    Procedures Procedures (including critical care time)  Medications Ordered in ED Medications - No data to display  ED Course  I have reviewed the triage vital signs and the nursing notes.  Pertinent labs & imaging results that were available during my care of the patient were reviewed by me and considered in my medical decision making (see chart for details).    MDM Rules/Calculators/A&P                          Strep screen was done and chest x-ray.  After reviewing patient's chest x-ray we discussed the results.  He is willing to do a 2 view x-ray and would prefer to wait for CT.  He denies feeling like the roof of his mouth was swollen.  Patient left with Dr. Orland Dec at change of shift to get the results of his two-view chest x-ray.  Final Clinical Impression(s) / ED Diagnoses Final diagnoses:  Sore throat  Post-nasal drip    Rx / DC Orders ED Discharge Orders    None    OTC Claritin or  Zyrtec   Plan discharge  Devoria Albe, MD, Concha Pyo, MD 04/04/20 [phone]

## 2020-04-04 NOTE — ED Notes (Signed)
PT moved to exam 16 from exam 18 following the complaint of "roach falling from the ceiling onto his chest." Insect terminated and CN notified of the same and room closed until exterminator can be called.

## 2020-04-04 NOTE — ED Triage Notes (Signed)
Pt with c/o sore throat and cough since Thursday.

## 2020-04-04 NOTE — ED Notes (Signed)
Pt to radiology at this time.

## 2020-04-04 NOTE — Discharge Instructions (Addendum)
Your repeat x-ray was normal.  Take Claritin or Zyrtec over-the-counter once a day to help with your postnasal drip.  Drink plenty of fluids.  Use sore throat lozenges for comfort.  Recheck if you get a fever, struggle to breathe, or you are unable to swallow.

## 2020-04-04 NOTE — ED Provider Notes (Signed)
  Provider Note MRN:  [id number]  Arrival date & time: 04/04/20    ED Course and Medical Decision Making  Assumed care from Dr. Lynelle Doctor at shift change.  Repeat x-ray reassuring, no suprahilar density.  Patient appropriate for discharge.  Procedures  Final Clinical Impressions(s) / ED Diagnoses     ICD-10-CM   1. Sore throat  J02.9   2. Post-nasal drip  R09.82     ED Discharge Orders    None        Discharge Instructions     Your repeat x-ray was normal.  Take Claritin or Zyrtec over-the-counter once a day to help with your postnasal drip.  Drink plenty of fluids.  Use sore throat lozenges for comfort.  Recheck if you get a fever, struggle to breathe, or you are unable to swallow.    Elmer Sow. Pilar Plate, MD [facility] Emergency Medicine [facility] [email or web address]    Sabas Sous, MD 04/04/20 [phone]

## 2020-07-25 IMAGING — DX DG CHEST 2V
2 series · 2 of 2 positions shown · non-contrast
Comparison: Single-view of the chest 05/25/2019. PA and lateral
chest 02/18/2011.

CLINICAL DATA: Shortness of breath and nonproductive cough. Patient
diagnosed with [id number]-DR 05/03/2019.

EXAM:
CHEST - 2 VIEW

[chest pa]
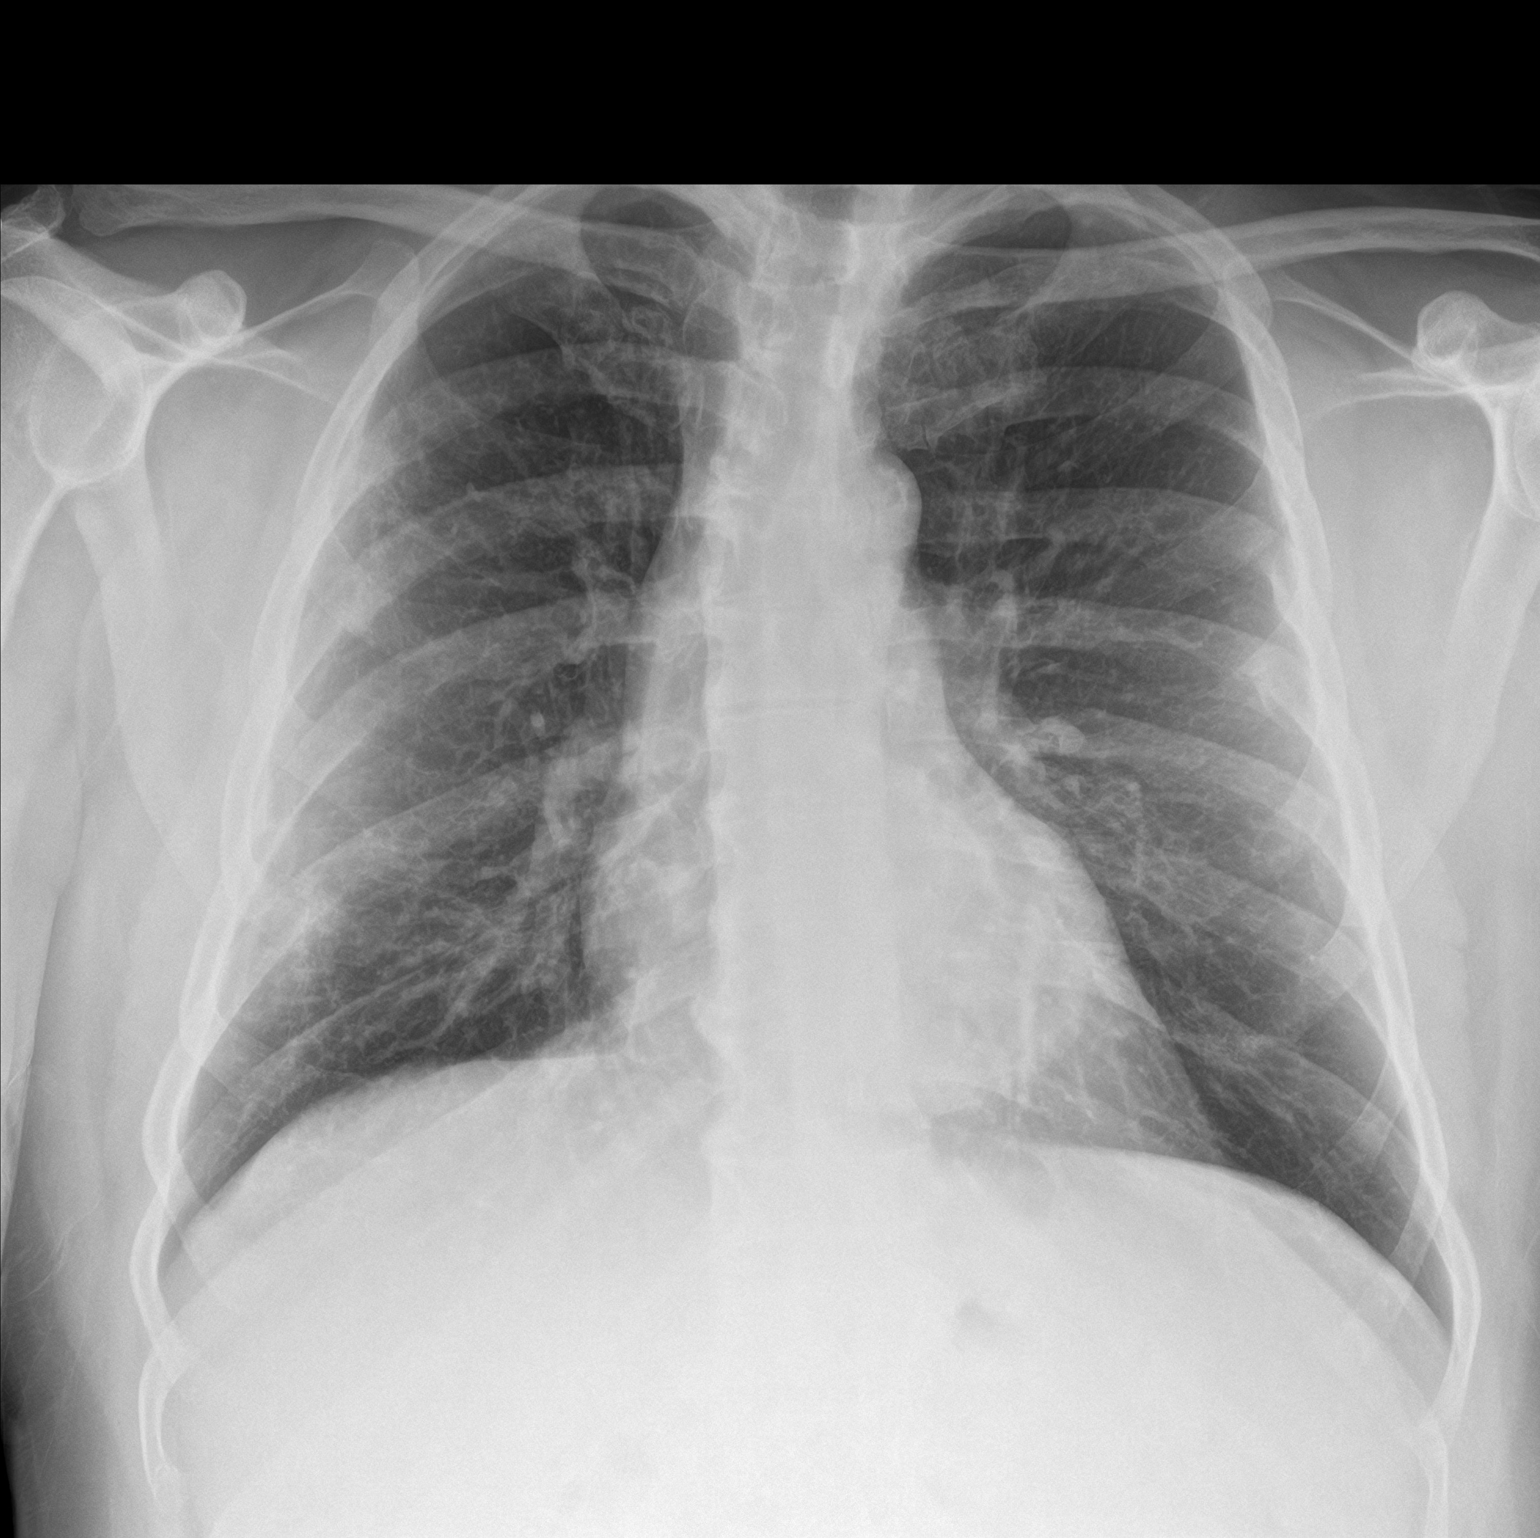

[chest lat]
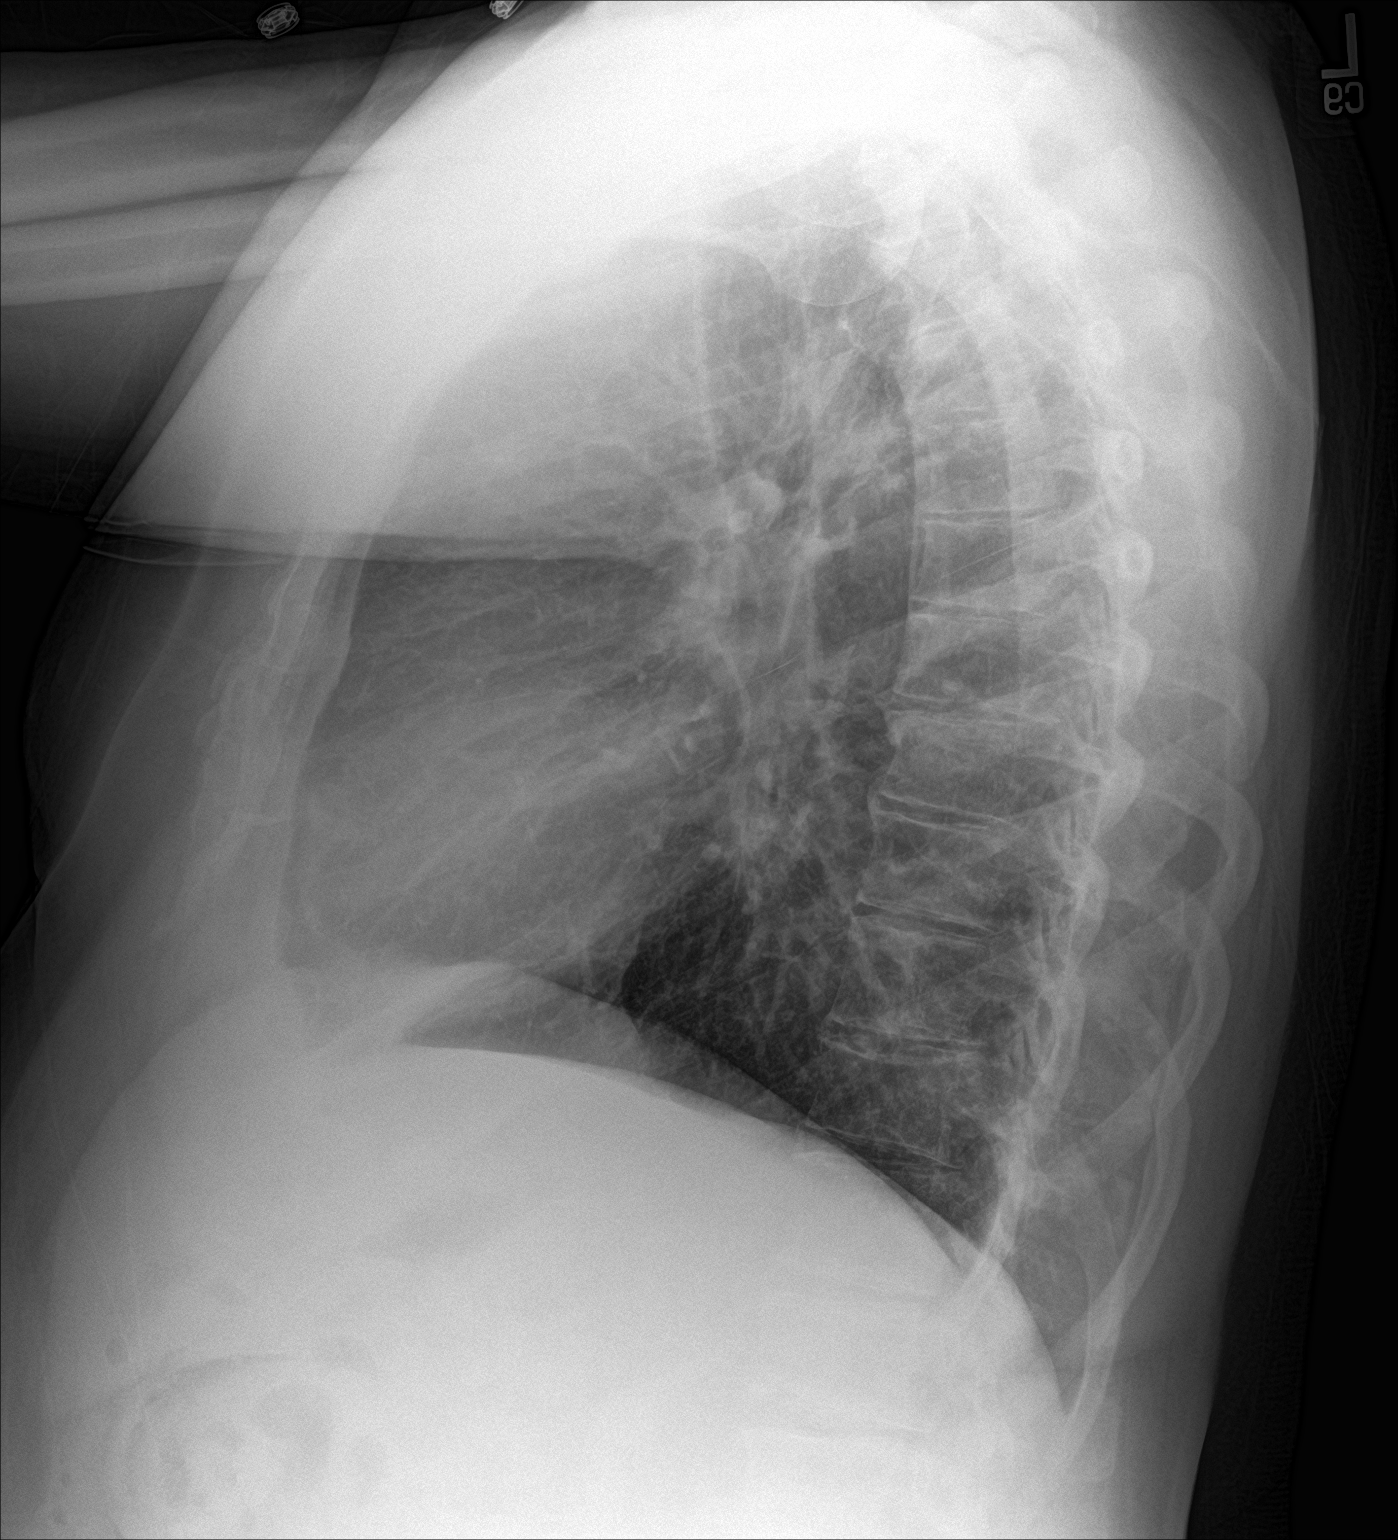

[2 of 2 positions shown; findings below may reference images not displayed]

FINDINGS: Patchy opacities in the periphery of the right upper and middle
lobes have improved since the most recent examination. The left lung
is clear. Heart size is normal. Atherosclerosis is noted. No
pneumothorax or pleural effusion. Remote left rib fractures noted.
IMPRESSION: Improved patchy opacities in the periphery of right upper and middle
lobes. No new abnormality.

## 2020-09-05 ENCOUNTER — Emergency Department (HOSPITAL_COMMUNITY): Payer: Managed Care, Other (non HMO)

## 2020-09-05 ENCOUNTER — Other Ambulatory Visit: Payer: Self-pay

## 2020-09-05 ENCOUNTER — Encounter (HOSPITAL_COMMUNITY): Payer: Self-pay | Admitting: Emergency Medicine

## 2020-09-05 ENCOUNTER — Emergency Department (HOSPITAL_COMMUNITY)
Admission: EM | Admit: 2020-09-05 | Discharge: 2020-09-05 | Disposition: A | Discharge status: Home / Self Care | Payer: Managed Care, Other (non HMO) | Attending: Emergency Medicine | Admitting: Emergency Medicine

## 2020-09-05 DIAGNOSIS — K529 Noninfective gastroenteritis and colitis, unspecified: Secondary | ICD-10-CM | POA: Diagnosis not present

## 2020-09-05 DIAGNOSIS — R1030 Lower abdominal pain, unspecified: Secondary | ICD-10-CM | POA: Diagnosis present

## 2020-09-05 DIAGNOSIS — Z8616 Personal history of COVID-19: Secondary | ICD-10-CM | POA: Diagnosis not present

## 2020-09-05 DIAGNOSIS — Z87891 Personal history of nicotine dependence: Secondary | ICD-10-CM | POA: Diagnosis not present

## 2020-09-05 DIAGNOSIS — R112 Nausea with vomiting, unspecified: Secondary | ICD-10-CM

## 2020-09-05 LAB — CBC WITH DIFFERENTIAL/PLATELET
Abs Immature Granulocytes: 0.07 10*3/uL (ref 0.00–0.07)
Basophils Absolute: 0.1 10*3/uL (ref 0.0–0.1)
Basophils Relative: 1 %
Eosinophils Absolute: 0 10*3/uL (ref 0.0–0.5)
Eosinophils Relative: 0 %
HCT: 41.9 % (ref 39.0–52.0)
Hemoglobin: 14 g/dL (ref 13.0–17.0)
Immature Granulocytes: 1 %
Lymphocytes Relative: 8 %
Lymphs Abs: 0.9 10*3/uL (ref 0.7–4.0)
MCH: 28.7 pg (ref 26.0–34.0)
MCHC: 33.4 g/dL (ref 30.0–36.0)
MCV: 86 fL (ref 80.0–100.0)
Monocytes Absolute: 0.9 10*3/uL (ref 0.1–1.0)
Monocytes Relative: 8 %
Neutro Abs: 8.8 10*3/uL — ABNORMAL HIGH (ref 1.7–7.7)
Neutrophils Relative %: 82 %
Platelets: 243 10*3/uL (ref 150–400)
RBC: 4.87 MIL/uL (ref 4.22–5.81)
RDW: 13.7 % (ref 11.5–15.5)
WBC: 10.7 10*3/uL — ABNORMAL HIGH (ref 4.0–10.5)
nRBC: 0 % (ref 0.0–0.2)

## 2020-09-05 LAB — COMPREHENSIVE METABOLIC PANEL
ALT: 22 U/L (ref 0–44)
AST: 20 U/L (ref 15–41)
Albumin: 3.5 g/dL (ref 3.5–5.0)
Alkaline Phosphatase: 58 U/L (ref 38–126)
Anion gap: 11 (ref 5–15)
BUN: 13 mg/dL (ref 6–20)
CO2: 25 mmol/L (ref 22–32)
Calcium: 8 mg/dL — ABNORMAL LOW (ref 8.9–10.3)
Chloride: 98 mmol/L (ref 98–111)
Creatinine, Ser: 0.89 mg/dL (ref 0.61–1.24)
GFR, Estimated: >60 mL/min (ref >60)
Glucose, Bld: 129 mg/dL — ABNORMAL HIGH (ref 70–99)
Potassium: 3.1 mmol/L — ABNORMAL LOW (ref 3.5–5.1)
Sodium: 134 mmol/L — ABNORMAL LOW (ref 135–145)
Total Bilirubin: 0.6 mg/dL (ref 0.3–1.2)
Total Protein: 6.6 g/dL (ref 6.5–8.1)

## 2020-09-05 LAB — URINALYSIS, ROUTINE W REFLEX MICROSCOPIC
Bilirubin Urine: NEGATIVE
Glucose, UA: NEGATIVE mg/dL
Hgb urine dipstick: NEGATIVE
Ketones, ur: NEGATIVE mg/dL
Leukocytes,Ua: NEGATIVE
Nitrite: NEGATIVE
Protein, ur: NEGATIVE mg/dL
Specific Gravity, Urine: 1.016 (ref 1.005–1.030)
pH: 6 (ref 5.0–8.0)

## 2020-09-05 LAB — LIPASE, BLOOD: Lipase: 25 U/L (ref 11–51)

## 2020-09-05 MED ORDER — ONDANSETRON HCL 4 MG/2ML IJ SOLN
4.0000 mg | Freq: Once | INTRAMUSCULAR | Status: AC
  Administered 2020-09-05: 4 mg via INTRAVENOUS
  Filled 2020-09-05: qty 2

## 2020-09-05 MED ORDER — IOHEXOL 300 MG/ML  SOLN
100.0000 mL | Freq: Once | INTRAMUSCULAR | Status: AC | PRN
  Administered 2020-09-05: 100 mL via INTRAVENOUS

## 2020-09-05 MED ORDER — SODIUM CHLORIDE 0.9 % IV BOLUS
1000.0000 mL | Freq: Once | INTRAVENOUS | Status: AC
  Administered 2020-09-05: 1000 mL via INTRAVENOUS

## 2020-09-05 MED ORDER — POTASSIUM CHLORIDE CRYS ER 20 MEQ PO TBCR
40.0000 meq | EXTENDED_RELEASE_TABLET | Freq: Once | ORAL | Status: AC
  Administered 2020-09-05: 40 meq via ORAL
  Filled 2020-09-05: qty 2

## 2020-09-05 MED ORDER — ONDANSETRON 4 MG PO TBDP: 4.0000 mg | ORAL_TABLET | Freq: Three times a day (TID) | ORAL | 0 refills | Status: DC | PRN

## 2020-09-05 NOTE — Discharge Instructions (Addendum)
Your work-up today was reassuring.  As discussed, frequent small sips of clear fluids today and bland diet as tolerated this afternoon.  Follow-up with your primary care provider for recheck, return to the emergency department if you develop any worsening symptoms such as increasing abdominal pain fever or continue to have persistent vomiting.

## 2020-09-05 NOTE — ED Triage Notes (Signed)
Pt c/o of generalized abdominal pain since Saturday. Denies any n/v/d since sunday

## 2020-09-05 NOTE — ED Notes (Signed)
Pt given food and drink.  Was able to tolerate both.

## 2020-09-05 NOTE — ED Provider Notes (Signed)
[facility] EMERGENCY DEPARTMENT Provider Note   CSN: [id number] Arrival date & time: 09/05/20  2122     History Chief Complaint  Patient presents with  . Abdominal Pain    Jonathan Woodard is a 60 y.o. male.  HPI      Jonathan Woodard is a 60 y.o. male who presents to the Emergency Department complaining of lower abdominal pain, nausea, vomiting, and diarrhea.  Symptoms began Saturday morning at 11 AM.  He states that he believes he ate something that made him sick.  Shortly after eating, he began feeling nausea and had multiple episodes of vomiting and loose watery stools.  He took Imodium Sunday and states his vomiting and diarrhea has since improved but he continues to have pain to his lower abdomen that he describes as constant and cramping in quality.  He denies urinary changes, fever, chills, chest pain, shortness of breath, dark or bloody stools.    Past Medical History:  Diagnosis Date  . Concussion   . COVID     Patient Active Problem List   Diagnosis Date Noted  . CONTUSION, RIGHT HAND 02/10/2009    Past Surgical History:  Procedure Laterality Date  . knee pain    . ROTATOR CUFF REPAIR         History reviewed. No pertinent family history.  Social History   Tobacco Use  . Smoking status: Former Games developer  . Smokeless tobacco: Never Used  Substance Use Topics  . Alcohol use: No  . Drug use: No    Home Medications Prior to Admission medications   Medication Sig Start Date End Date Taking? Authorizing Provider  ibuprofen (ADVIL) 200 MG tablet Take 200 mg by mouth every 6 (six) hours as needed.   Yes [provider]  azithromycin (ZITHROMAX Z-PAK) 250 MG tablet Take one tablet daily for 4 additional days. Patient not taking: No sig reported 05/26/19   Burgess Amor, PA-C    Allergies    Dilaudid [hydromorphone hcl]  Review of Systems   Review of Systems  Constitutional: Positive for appetite change and fatigue. Negative for chills and fever.   Respiratory: Negative for shortness of breath.   Cardiovascular: Negative for chest pain.  Gastrointestinal: Positive for abdominal pain, diarrhea, nausea and vomiting. Negative for blood in stool.  Genitourinary: Negative for decreased urine volume, difficulty urinating, dysuria and flank pain.  Musculoskeletal: Negative for back pain.  Skin: Negative for color change and rash.  Neurological: Negative for dizziness, weakness and numbness.  Hematological: Negative for adenopathy.    Physical Exam Updated Vital Signs BP 128/68 (BP Location: Right Arm)   Pulse (!) 102   Temp 98.6 F (37 C) (Oral)   Resp 18   Ht 5\' 11"  (1.803 m)   Wt 105.2 kg   SpO2 98%   BMI 32.36 kg/m   Physical Exam Vitals and nursing note reviewed.  Constitutional:      General: He is not in acute distress.    Appearance: Normal appearance. He is not ill-appearing or toxic-appearing.  HENT:     Head: Normocephalic.     Mouth/Throat:     Mouth: Mucous membranes are dry.  Eyes:     Conjunctiva/sclera: Conjunctivae normal.     Pupils: Pupils are equal, round, and reactive to light.  Neck:     Thyroid: No thyromegaly.     Meningeal: Kernig's sign absent.  Cardiovascular:     Rate and Rhythm: Normal rate and regular rhythm.  Pulses: Normal pulses.  Pulmonary:     Effort: Pulmonary effort is normal.     Breath sounds: Normal breath sounds. No wheezing.  Abdominal:     General: There is no distension.     Palpations: Abdomen is soft.     Tenderness: There is abdominal tenderness. There is no guarding or rebound.     Comments: Diffuse tenderness to palpation of the lower abdomen.  No guarding or rebound tenderness.  No CVA tenderness.  Musculoskeletal:        General: Normal range of motion.     Cervical back: Normal range of motion.     Right lower leg: No edema.     Left lower leg: No edema.  Skin:    General: Skin is warm.     Capillary Refill: Capillary refill takes less than 2 seconds.      Findings: No rash.  Neurological:     General: No focal deficit present.     Mental Status: He is alert.     Sensory: No sensory deficit.     Motor: No weakness.     ED Results / Procedures / Treatments   Labs (all labs ordered are listed, but only abnormal results are displayed) Labs Reviewed  COMPREHENSIVE METABOLIC PANEL - Abnormal; Notable for the following components:      Result Value   Sodium 134 (*)    Potassium 3.1 (*)    Glucose, Bld 129 (*)    Calcium 8.0 (*)    All other components within normal limits  CBC WITH DIFFERENTIAL/PLATELET - Abnormal; Notable for the following components:   WBC 10.7 (*)    Neutro Abs 8.8 (*)    All other components within normal limits  LIPASE, BLOOD  URINALYSIS, ROUTINE W REFLEX MICROSCOPIC    EKG None  Radiology CT ABDOMEN PELVIS W CONTRAST  Result Date: 09/05/2020 CLINICAL DATA:  Nausea, vomiting, diarrhea, and lower abdominal pain beginning 2 days ago EXAM: CT ABDOMEN AND PELVIS WITH CONTRAST TECHNIQUE: Multidetector CT imaging of the abdomen and pelvis was performed using the standard protocol following bolus administration of intravenous contrast. Sagittal and coronal MPR images reconstructed from axial data set. CONTRAST:  OMNIPAQUE IOHEXOL 300 MG/ML SOLN IV. No oral contrast. COMPARISON:  07/09/2001 FINDINGS: Lower chest: Minimal dependent bibasilar atelectasis Hepatobiliary: Gallbladder and liver normal appearance Pancreas: Normal appearance Spleen: Normal appearance Adrenals/Urinary Tract: Adrenal glands normal appearance. Small BILATERAL peripelvic renal cysts. Kidneys, ureters, and bladder otherwise normal appearance Stomach/Bowel: Normal appendix. Stomach decompressed but otherwise unremarkable. Diverticulosis of distal descending and sigmoid colon without evidence of diverticulitis. Thickening of small bowel loops in the RIGHT pelvis consistent with enteritis. Hyperemia of associated mesentery. Remaining bowel loops  unremarkable. No evidence of obstruction. Vascular/Lymphatic: Aorta normal caliber. Minimal atherosclerotic calcification aorta. No adenopathy. Reproductive: Prostate gland minimally prominent 4.7 x 3.7 x 3.1 cm (volume = 28 cm^3). Other: Small amount of free fluid in pelvis. No free air. Small RIGHT inguinal hernia containing fat. Musculoskeletal: Degenerative disc and facet disease changes lumbar spine with encroachment upon cervical neural foramina by endplate spurs and facet hypertrophy. IMPRESSION: Bowel wall thickening of small bowel loops in the RIGHT pelvis with hyperemia of associated mesentery consistent with enteritis; differential diagnosis favors infectious versus inflammatory bowel disease etiologies. Aortic Atherosclerosis (ICD10-I70.0). Electronically Signed   By: Ulyses Southward M.D.   On: 09/05/2020 11:33    Procedures Procedures   Medications Ordered in ED Medications  sodium chloride 0.9 % bolus 1,000 mL (  0 mLs Intravenous Stopped 09/05/20 1106)  ondansetron (ZOFRAN) injection 4 mg (4 mg Intravenous Given 09/05/20 0932)  iohexol (OMNIPAQUE) 300 MG/ML solution 100 mL (100 mLs Intravenous Contrast Given 09/05/20 1125)  potassium chloride SA (KLOR-CON) CR tablet 40 mEq (40 mEq Oral Given 09/05/20 1213)    ED Course  I have reviewed the triage vital signs and the nursing notes.  Pertinent labs & imaging results that were available during my care of the patient were reviewed by me and considered in my medical decision making (see chart for details).    MDM Rules/Calculators/A&P                          Patient here with lower abdominal pain, vomiting and diarrhea symptoms present for several days.  States that he ate at [employer] and believes he may have food poisoning.  On exam he has diffuse tenderness of the lower abdomen.  He is otherwise well-appearing nontoxic.  Mucous membranes are slightly dry.  Will obtain labs, CT abdomen pelvis and administer IV fluids and antiemetic.   With diffuse lower abdominal pain, concern for diverticulitis versus appendicitis.  Labs show mild hypokalemia which is consistent with reported vomiting and diarrhea.  No significant leukocytosis, CT abdomen pelvis shows bowel wall thickening and hyperemia of the mesentery, c/w enteritis.    On recheck, patient resting comfortably.  He has tolerated oral fluid challenge and ate crackers.  States that he is feeling better.  Vital signs reviewed.  I feel that he is appropriate for discharge home, he agrees to close outpatient follow-up with PCP.  Strict return precautions were also discussed.  Final Clinical Impression(s) / ED Diagnoses Final diagnoses:  Enteritis  Lower abdominal pain  Non-intractable vomiting with nausea, unspecified vomiting type    Rx / DC Orders ED Discharge Orders    None       Pauline Aus, PA-C 09/05/20 1510    Horton, Clabe Seal, DO 09/05/20 1600

## 2020-12-10 ENCOUNTER — Ambulatory Visit
Admission: RE | Admit: 2020-12-10 | Discharge: 2020-12-10 | Disposition: A | Discharge status: Home / Self Care | Payer: Managed Care, Other (non HMO) | Source: Ambulatory Visit | Attending: Emergency Medicine | Admitting: Emergency Medicine

## 2020-12-10 ENCOUNTER — Other Ambulatory Visit: Payer: Self-pay

## 2020-12-10 VITALS — BP 130/83 | HR 100 | Temp 99.3°F | Resp 15

## 2020-12-10 DIAGNOSIS — R0981 Nasal congestion: Secondary | ICD-10-CM | POA: Diagnosis not present

## 2020-12-10 MED ORDER — AMOXICILLIN-POT CLAVULANATE 875-125 MG PO TABS: 1.0000 | ORAL_TABLET | Freq: Two times a day (BID) | ORAL | 0 refills | Status: AC

## 2020-12-10 NOTE — ED Provider Notes (Signed)
[facility]   [id number] 12/10/20 Arrival Time: 1158   CC: sinus congestion  SUBJECTIVE: History from: patient.  Jonathan Woodard is a 60 y.o. male who presents with sinus congestion, PND X 1 week.  Denies sick exposure to COVID, flu or strep.  Denies alleviating or aggravating factors.  Reports/ denies previous symptoms in the past.   Denies fever, chills, fatigue, sinus pain, rhinorrhea, sore throat, SOB, wheezing, chest pain, nausea, changes in bowel or bladder habits.     ROS: As per HPI.  All other pertinent ROS negative.     Past Medical History:  Diagnosis Date   Concussion    COVID    Past Surgical History:  Procedure Laterality Date   knee pain     ROTATOR CUFF REPAIR     Allergies  Allergen Reactions   Dilaudid [Hydromorphone Hcl] Nausea Only    Pt had syncopal episode after receiving 1 mg dilaudid   No current facility-administered medications on file prior to encounter.   Current Outpatient Medications on File Prior to Encounter  Medication Sig Dispense Refill   ibuprofen (ADVIL) 200 MG tablet Take 200 mg by mouth every 6 (six) hours as needed.     ondansetron (ZOFRAN ODT) 4 MG disintegrating tablet Take 1 tablet (4 mg total) by mouth every 8 (eight) hours as needed for nausea or vomiting. 10 tablet 0   Social History   Socioeconomic History   Marital status: Married    Spouse name: Not on file   Number of children: Not on file   Years of education: Not on file   Highest education level: Not on file  Occupational History   Not on file  Tobacco Use   Smoking status: Former   Smokeless tobacco: Never  Substance and Sexual Activity   Alcohol use: No   Drug use: No   Sexual activity: Yes    Birth control/protection: None  Other Topics Concern   Not on file  Social History Narrative   Not on file   Social Determinants of Health   Financial Resource Strain: Not on file  Food Insecurity: Not on file  Transportation Needs: Not on file   Physical Activity: Not on file  Stress: Not on file  Social Connections: Not on file  Intimate Partner Violence: Not on file   No family history on file.  OBJECTIVE:  Vitals:   12/10/20 1211  BP: 130/83  Pulse: 100  Resp: 15  Temp: 99.3 F (37.4 C)  SpO2: 96%    General appearance: alert; well-appearing, nontoxic; speaking in full sentences and tolerating own secretions HEENT: NCAT; Ears: EACs clear, TMs pearly gray; Eyes: PERRL.  EOM grossly intact.Nose: nares patent without rhinorrhea, Throat: oropharynx clear, tonsils non erythematous or enlarged, uvula midline  Neck: supple without LAD Lungs: unlabored respirations, symmetrical air entry; cough: absent; no respiratory distress; CTAB Heart: regular rate and rhythm.  Skin: warm and dry Psychological: alert and cooperative; normal mood and affect   ASSESSMENT & PLAN:  1. Sinus congestion     Meds ordered this encounter  Medications   amoxicillin-clavulanate (AUGMENTIN) 875-125 MG tablet    Sig: Take 1 tablet by mouth every 12 (twelve) hours for 10 days.    Dispense:  20 tablet    Refill:  0    Order Specific Question:   Supervising Provider    Answer:   Eustace Moore [[id number]]    Get plenty of rest and push fluids Augmentin prescribed  Use  OTC zyrtec for nasal congestion, runny nose, and/or sore throat Use OTC flonase for nasal congestion and runny nose Use medications daily for symptom relief Use OTC medications like ibuprofen or tylenol as needed fever or pain Call or go to the ED if you have any new or worsening symptoms such as fever, worsening cough, shortness of breath, chest tightness, chest pain, turning blue, changes in mental status, etc...   Reviewed expectations re: course of current medical issues. Questions answered. Outlined signs and symptoms indicating need for more acute intervention. Patient verbalized understanding. After Visit Summary given.          [name],  PA-C 12/10/20 1240

## 2020-12-10 NOTE — ED Triage Notes (Signed)
sinus congestion, post nasal drainage x 1 week, generally doesnt feel well. denies fever and chills

## 2020-12-10 NOTE — Discharge Instructions (Addendum)
Get plenty of rest and push fluids Augmentin prescribed  Use OTC zyrtec for nasal congestion, runny nose, and/or sore throat Use OTC flonase for nasal congestion and runny nose Use medications daily for symptom relief Use OTC medications like ibuprofen or tylenol as needed fever or pain Call or go to the ED if you have any new or worsening symptoms such as fever, worsening cough, shortness of breath, chest tightness, chest pain, turning blue, changes in mental status, etc..Marland Kitchen

## 2021-05-19 ENCOUNTER — Ambulatory Visit: Payer: Self-pay

## 2021-05-19 ENCOUNTER — Other Ambulatory Visit: Payer: Self-pay

## 2021-05-19 ENCOUNTER — Encounter: Payer: Self-pay | Admitting: Emergency Medicine

## 2021-05-19 ENCOUNTER — Ambulatory Visit
Admission: EM | Admit: 2021-05-19 | Discharge: 2021-05-19 | Disposition: A | Discharge status: Home / Self Care | Payer: Managed Care, Other (non HMO) | Attending: Physician Assistant | Admitting: Physician Assistant

## 2021-05-19 DIAGNOSIS — Z1152 Encounter for screening for COVID-19: Secondary | ICD-10-CM

## 2021-05-19 MED ORDER — BENZONATATE 100 MG PO CAPS: 100.0000 mg | ORAL_CAPSULE | Freq: Four times a day (QID) | ORAL | 0 refills | Status: AC | PRN

## 2021-05-19 MED ORDER — IBUPROFEN 800 MG PO TABS: 800.0000 mg | ORAL_TABLET | Freq: Three times a day (TID) | ORAL | 0 refills | Status: DC | PRN

## 2021-05-19 NOTE — Discharge Instructions (Addendum)
Covid and Flu are pending

## 2021-05-19 NOTE — ED Provider Notes (Signed)
[facility]    CSN: [id number] Arrival date & time: 05/19/21  1653      History   Chief Complaint Chief Complaint  Patient presents with   Nasal Congestion   Generalized Body Aches   Cough   APPT 1700    HPI Jonathan Woodard is a 60 y.o. male.   The history is provided by the patient. No language interpreter was used.  Cough Cough characteristics:  Non-productive Sputum characteristics:  Nondescript Severity:  Moderate Timing:  Constant Progression:  Worsening Relieved by:  Nothing Worsened by:  Nothing Ineffective treatments:  None tried  Past Medical History:  Diagnosis Date   Concussion    COVID     Patient Active Problem List   Diagnosis Date Noted   CONTUSION, RIGHT HAND 02/10/2009    Past Surgical History:  Procedure Laterality Date   knee pain     ROTATOR CUFF REPAIR         Home Medications    Prior to Admission medications   Medication Sig Start Date End Date Taking? Authorizing Provider  benzonatate (TESSALON PERLES) 100 MG capsule Take 1 capsule (100 mg total) by mouth every 6 (six) hours as needed for cough. 05/19/21 05/19/22 Yes Cheron Schaumann K, PA-C  benzonatate (TESSALON PERLES) 100 MG capsule Take 1 capsule (100 mg total) by mouth every 6 (six) hours as needed for cough. 05/19/21 05/19/22 Yes Elson Areas, PA-C  ibuprofen (ADVIL) 800 MG tablet Take 1 tablet (800 mg total) by mouth every 8 (eight) hours as needed. 05/19/21  Yes Cheron Schaumann K, PA-C  ondansetron (ZOFRAN ODT) 4 MG disintegrating tablet Take 1 tablet (4 mg total) by mouth every 8 (eight) hours as needed for nausea or vomiting. 09/05/20   Pauline Aus, PA-C    Family History History reviewed. No pertinent family history.  Social History Social History   Tobacco Use   Smoking status: Former   Smokeless tobacco: Never  Substance Use Topics   Alcohol use: No   Drug use: No     Allergies   Dilaudid [hydromorphone hcl]   Review of Systems Review  of Systems  Respiratory:  Positive for cough.   All other systems reviewed and are negative.   Physical Exam Triage Vital Signs ED Triage Vitals  Enc Vitals Group     BP 05/19/21 1703 (!) 166/94     Pulse Rate 05/19/21 1703 99     Resp 05/19/21 1703 18     Temp 05/19/21 1703 98.6 F (37 C)     Temp Source 05/19/21 1703 Oral     SpO2 05/19/21 1703 96 %     Weight --      Height --      Head Circumference --      Peak Flow --      Pain Score 05/19/21 1702 4     Pain Loc --      Pain Edu? --      Excl. in GC? --    No data found.  Updated Vital Signs BP (!) 166/94 (BP Location: Right Arm)    Pulse 99    Temp 98.6 F (37 C) (Oral)    Resp 18    SpO2 96%   Visual Acuity Right Eye Distance:   Left Eye Distance:   Bilateral Distance:    Right Eye Near:   Left Eye Near:    Bilateral Near:     Physical Exam Vitals and nursing note reviewed.  Constitutional:      General: He is not in acute distress.    Appearance: He is well-developed.  HENT:     Head: Normocephalic and atraumatic.  Eyes:     Conjunctiva/sclera: Conjunctivae normal.  Cardiovascular:     Rate and Rhythm: Normal rate and regular rhythm.     Heart sounds: No murmur heard. Pulmonary:     Effort: Pulmonary effort is normal. No respiratory distress.     Breath sounds: Normal breath sounds.  Abdominal:     Palpations: Abdomen is soft.  Musculoskeletal:        General: No swelling. Normal range of motion.     Cervical back: Neck supple.  Skin:    General: Skin is warm and dry.     Capillary Refill: Capillary refill takes less than 2 seconds.  Neurological:     Mental Status: He is alert.  Psychiatric:        Mood and Affect: Mood normal.     UC Treatments / Results  Labs (all labs ordered are listed, but only abnormal results are displayed) Labs Reviewed  COVID-19, FLU A+B NAA    EKG   Radiology No results found.  Procedures Procedures (including critical care time)  Medications  Ordered in UC Medications - No data to display  Initial Impression / Assessment and Plan / UC Course  I have reviewed the triage vital signs and the nursing notes.  Pertinent labs & imaging results that were available during my care of the patient were reviewed by me and considered in my medical decision making (see chart for details).      Final Clinical Impressions(s) / UC Diagnoses   Final diagnoses:  Encounter for screening for COVID-19     Discharge Instructions      Covid and Flu are pending   ED Prescriptions     Medication Sig Dispense Auth. Provider   benzonatate (TESSALON PERLES) 100 MG capsule Take 1 capsule (100 mg total) by mouth every 6 (six) hours as needed for cough. 30 capsule Korrin Waterfield K, [place]   benzonatate (TESSALON PERLES) 100 MG capsule Take 1 capsule (100 mg total) by mouth every 6 (six) hours as needed for cough. 30 capsule Terren Haberle K, [place]   ibuprofen (ADVIL) 800 MG tablet Take 1 tablet (800 mg total) by mouth every 8 (eight) hours as needed. 30 tablet Elson Areas, [place]      PDMP not reviewed this encounter.   Elson Areas, [place] 05/19/21 [id number]

## 2021-05-19 NOTE — ED Triage Notes (Signed)
Patient c/o nasal congestion, generalized body aches, and nonproductive cough x 1 week.   Patient denies SOB or fever.   Patient has taken ibuprofen with some relief of symptoms.

## 2021-05-20 LAB — COVID-19, FLU A+B NAA
Influenza A, NAA: DETECTED — AB
Influenza B, NAA: NOT DETECTED
SARS-CoV-2, NAA: NOT DETECTED

## 2021-10-23 IMAGING — CT CT ABD-PELV W/ CM
2 of 5 series · 16 of 46 positions shown, 18 images · IV contrast (Omnipaque or Isovue)
Comparison: 07/09/2001

CLINICAL DATA: Nausea, vomiting, diarrhea, and lower abdominal pain
beginning 2 days ago

EXAM:
CT ABDOMEN AND PELVIS WITH CONTRAST
TECHNIQUE: Multidetector CT imaging of the abdomen and pelvis was performed
using the standard protocol following bolus administration of
intravenous contrast. Sagittal and coronal MPR images reconstructed
from axial data set.
CONTRAST:  100mL OMNIPAQUE IOHEXOL 300 MG/ML SOLN IV. No oral
contrast.

[Series 2: axial st · axial · 0.88mm/px · z∈[+740,+1165]mm · 13 of 97 slices shown, 15 images]
[im 6/97  soft-tissue]
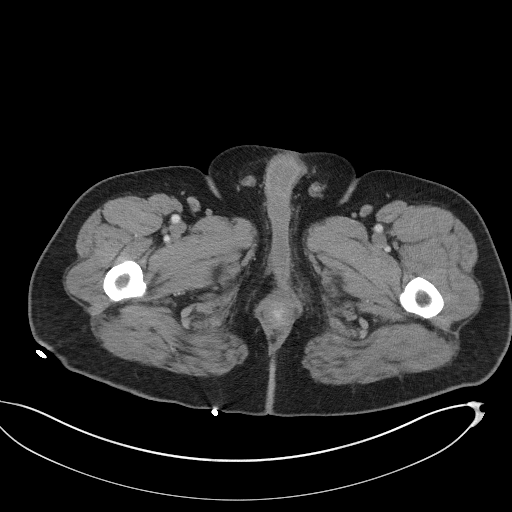
[im 6/97  bone]
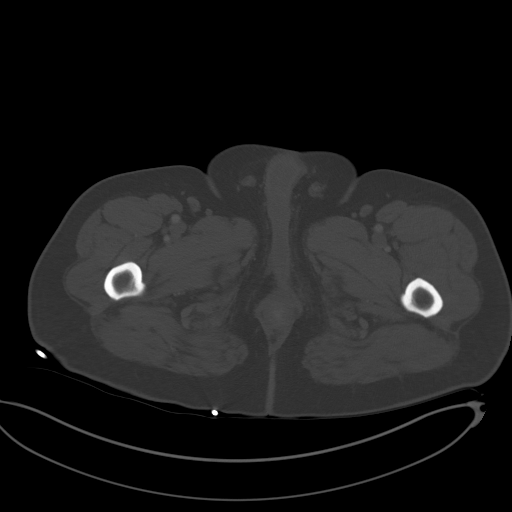
[im 16/97  soft-tissue]
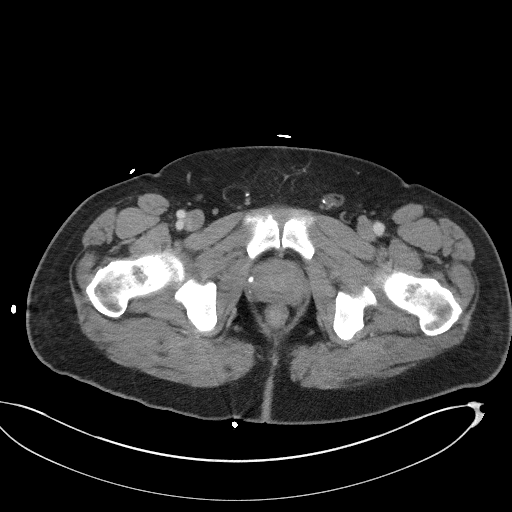
[im 21/97  soft-tissue]
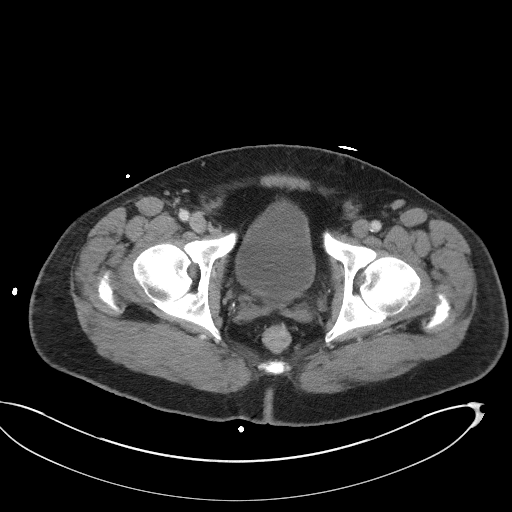
[im 26/97  soft-tissue]
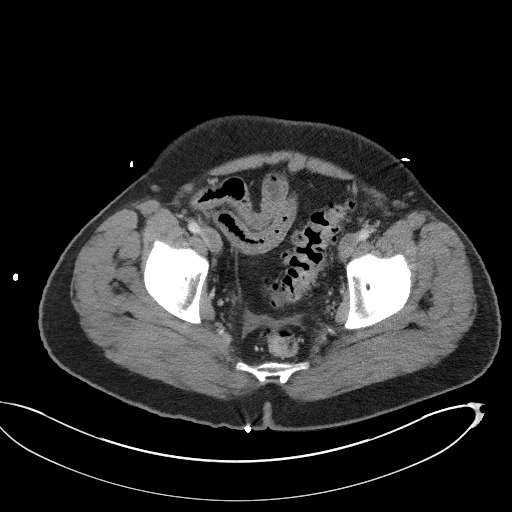
[im 36/97  soft-tissue]
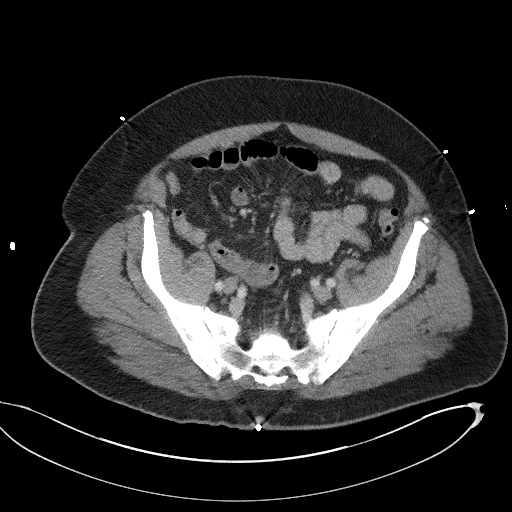
[im 41/97  soft-tissue]
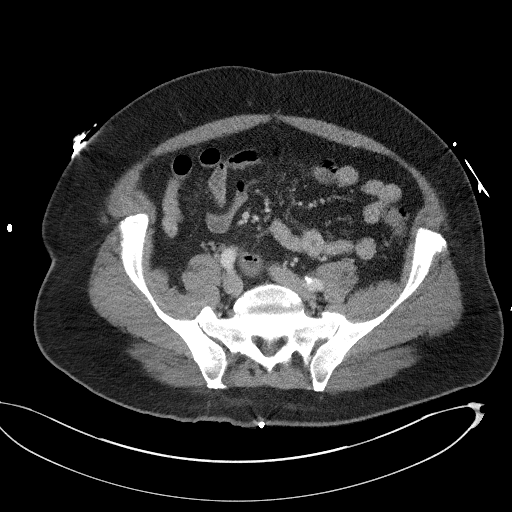
[im 51/97  soft-tissue]
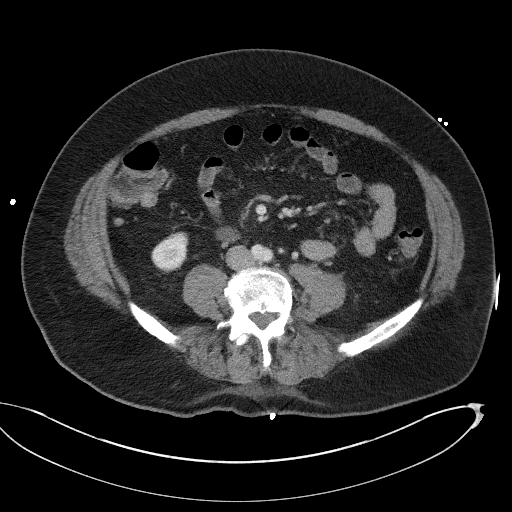
[im 56/97  soft-tissue]
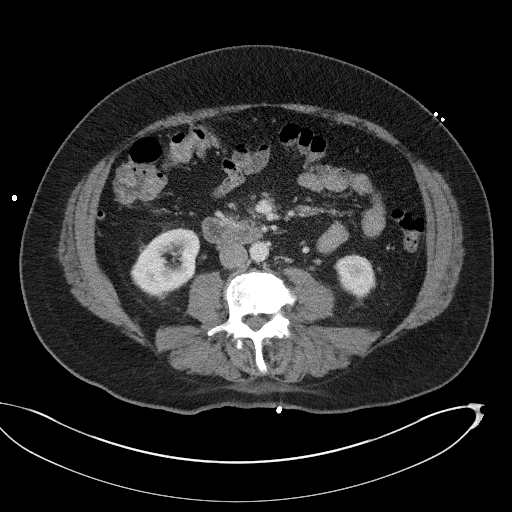
[im 61/97  soft-tissue]
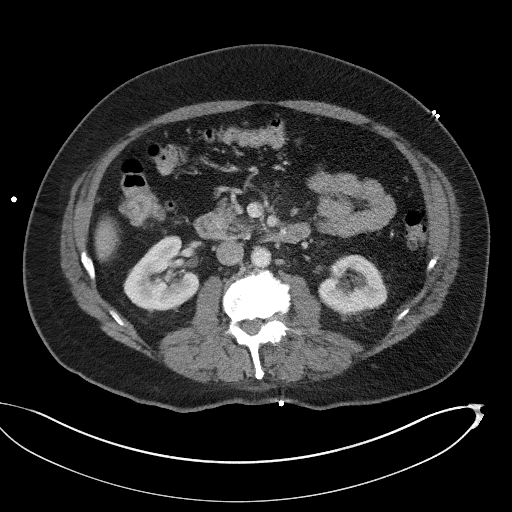
[im 61/97  bone]
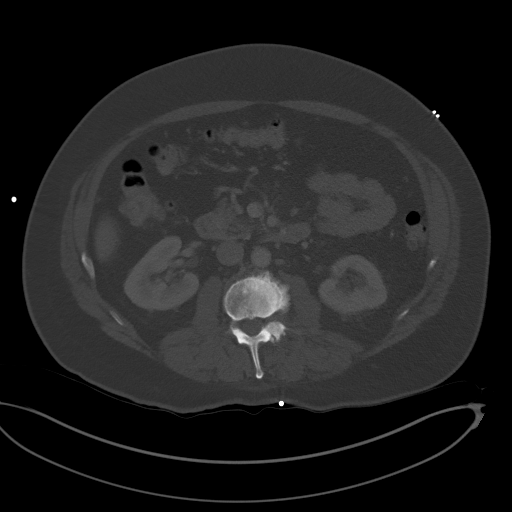
[im 71/97  soft-tissue]
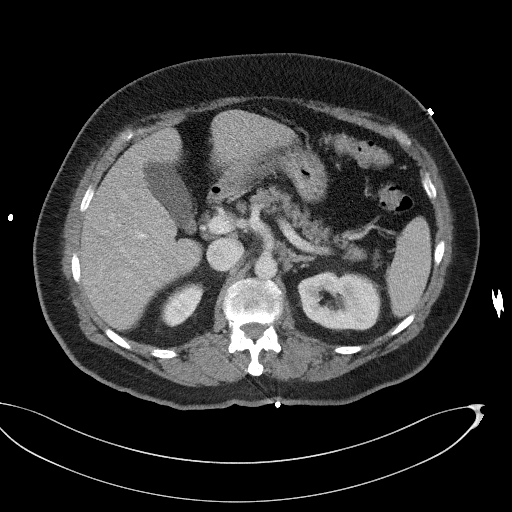
[im 76/97  soft-tissue]
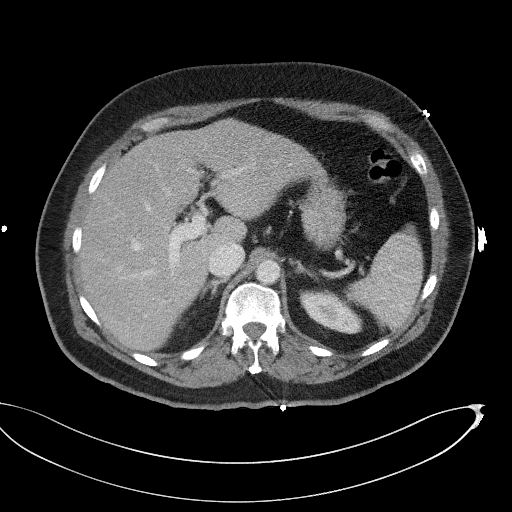
[im 81/97  soft-tissue]
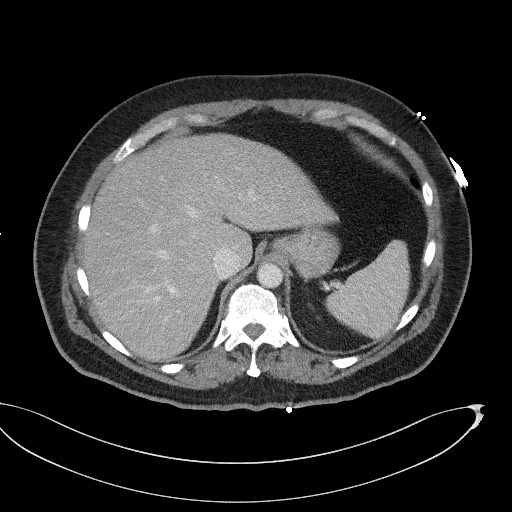
[im 91/97  soft-tissue]
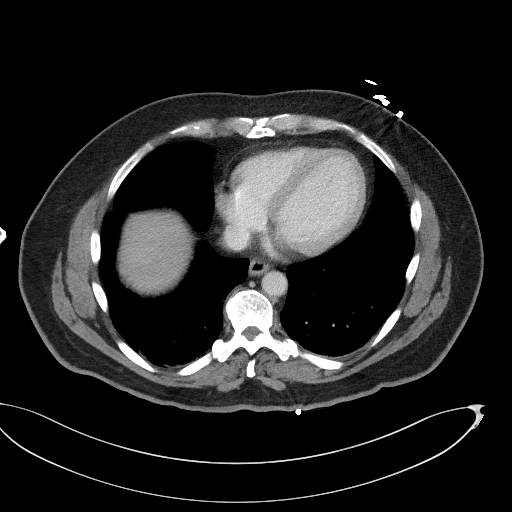

[Series 5: coronal st · coronal · 0.85mm/px · 3 of 125 slices shown]
[im 42/125  soft-tissue]
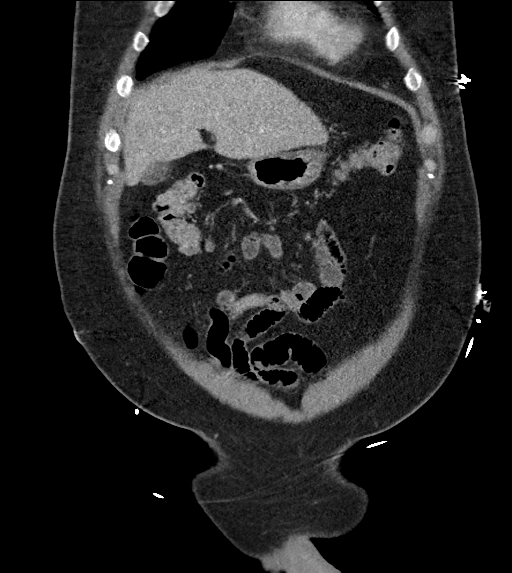
[im 56/125  soft-tissue]
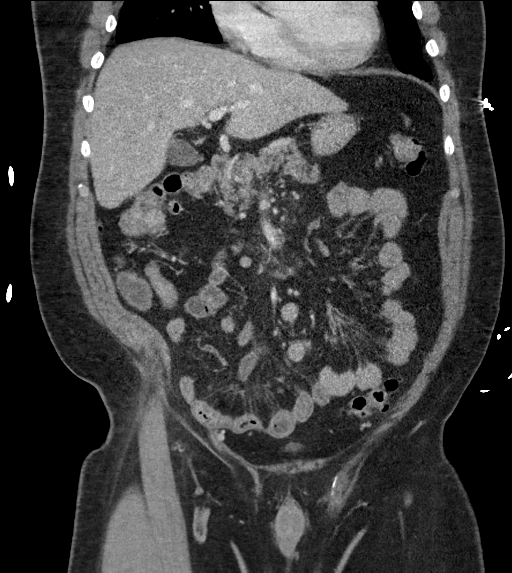
[im 69/125  soft-tissue]
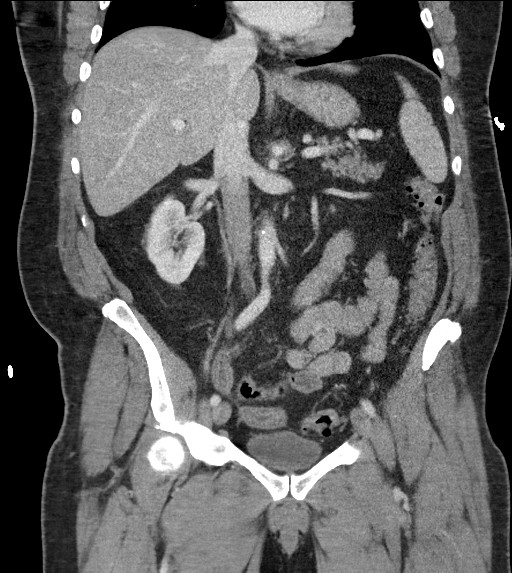

[16 of 46 positions shown; findings below may reference images not displayed]

FINDINGS: Lower chest: Minimal dependent bibasilar atelectasis

Hepatobiliary: Gallbladder and liver normal appearance

Pancreas: Normal appearance

Spleen: Normal appearance

Adrenals/Urinary Tract: Adrenal glands normal appearance. Small
BILATERAL peripelvic renal cysts. Kidneys, ureters, and bladder
otherwise normal appearance

Stomach/Bowel: Normal appendix. Stomach decompressed but otherwise
unremarkable. Diverticulosis of distal descending and sigmoid colon
without evidence of diverticulitis. Thickening of small bowel loops
in the RIGHT pelvis consistent with enteritis. Hyperemia of
associated mesentery. Remaining bowel loops unremarkable. No
evidence of obstruction.

Vascular/Lymphatic: Aorta normal caliber. Minimal atherosclerotic
calcification aorta. No adenopathy.

Reproductive: Prostate gland minimally prominent 4.7 x 3.7 x 3.1 cm
(volume = 28 cm^3).

Other: Small amount of free fluid in pelvis. No free air. Small
RIGHT inguinal hernia containing fat.

Musculoskeletal: Degenerative disc and facet disease changes lumbar
spine with encroachment upon cervical neural foramina by endplate
spurs and facet hypertrophy.
IMPRESSION: Bowel wall thickening of small bowel loops in the RIGHT pelvis with
hyperemia of associated mesentery consistent with enteritis;
differential diagnosis favors infectious versus inflammatory bowel
disease etiologies.

Aortic Atherosclerosis ([id number]-NF7.7).

## 2021-12-06 ENCOUNTER — Ambulatory Visit (INDEPENDENT_AMBULATORY_CARE_PROVIDER_SITE_OTHER): Payer: Self-pay | Admitting: Orthopaedic Surgery

## 2021-12-06 ENCOUNTER — Encounter: Payer: Self-pay | Admitting: Orthopaedic Surgery

## 2021-12-06 ENCOUNTER — Ambulatory Visit (INDEPENDENT_AMBULATORY_CARE_PROVIDER_SITE_OTHER): Payer: Self-pay

## 2021-12-06 DIAGNOSIS — M25561 Pain in right knee: Secondary | ICD-10-CM

## 2021-12-06 DIAGNOSIS — M1711 Unilateral primary osteoarthritis, right knee: Secondary | ICD-10-CM

## 2021-12-06 MED ORDER — METHYLPREDNISOLONE ACETATE 40 MG/ML IJ SUSP
80.0000 mg | INTRAMUSCULAR | Status: AC | PRN
  Administered 2021-12-06: 80 mg via INTRA_ARTICULAR

## 2021-12-06 MED ORDER — BUPIVACAINE HCL 0.25 % IJ SOLN
2.0000 mL | INTRAMUSCULAR | Status: AC | PRN
  Administered 2021-12-06: 2 mL via INTRA_ARTICULAR

## 2021-12-06 MED ORDER — LIDOCAINE HCL 1 % IJ SOLN
2.0000 mL | INTRAMUSCULAR | Status: AC | PRN
  Administered 2021-12-06: 2 mL

## 2021-12-06 NOTE — Progress Notes (Signed)
Office Visit Note   Patient: Jonathan Woodard           Date of Birth: 1961/04/13           MRN: [id number] Visit Date: 12/06/2021              Requested by: Benita Stabile, MD [address] [place],  [place] [address] PCP: Benita Stabile, MD   Assessment & Plan: Visit Diagnoses:  1. Right knee pain, unspecified chronicity   2. Unilateral primary osteoarthritis, right knee     Plan: Mr. Camper relates a chronic history of right knee stiffness and soreness in the occasional swelling.  Films demonstrate tricompartmental degenerative changes with peripheral osteophytes, subchondral sclerosis and some narrowing of the medial joint space.  Long discussion regarding the arthritis and treatment options including over-the-counter medicines ,exercises, occasional cortisone injection and/or viscosupplementation.  Also discussed definitive treatment of knee replacement but I do not think he is at that point yet.  We will proceed with a cortisone injection today and monitor his response.  Follow-Up Instructions: Return if symptoms worsen or fail to improve.   Orders:  Orders Placed This Encounter  Procedures   XR KNEE 3 VIEW RIGHT   No orders of the defined types were placed in this encounter.     Procedures: Large Joint Inj: R knee on 12/06/2021 10:49 AM Indications: pain and diagnostic evaluation Details: 25 G 1.5 in needle, anteromedial approach  Arthrogram: No  Medications: 2 mL lidocaine 1 %; 80 mg methylPREDNISolone acetate 40 MG/ML; 2 mL bupivacaine 0.25 % Procedure, treatment alternatives, risks and benefits explained, specific risks discussed. Consent was given by the patient. Immediately prior to procedure a time out was called to verify the correct patient, procedure, equipment, support staff and site/side marked as required. Patient was prepped and draped in the usual sterile fashion.       Clinical Data: No additional findings.   Subjective: Chief Complaint  Patient  presents with   Right Knee - Pain  Chronic history of right knee pain with stiffness soreness and occasional swelling.  Minimal symptoms on the left.  Denies any injury or trauma.  Has not had any fever chills or prior surgery on the knee but does note that his knee is stiff when he gets up from a sitting position when he is on his feet throughout.  Toenail notes some discomfort.  HPI  Review of Systems   Objective: Vital Signs: There were no vitals taken for this visit.  Physical Exam Constitutional:      Appearance: He is well-developed.  Eyes:     Pupils: Pupils are equal, round, and reactive to light.  Pulmonary:     Effort: Pulmonary effort is normal.  Skin:    General: Skin is warm and dry.  Neurological:     Mental Status: He is alert and oriented to person, place, and time.  Psychiatric:        Behavior: Behavior normal.     Ortho Exam awake alert and oriented x3.  Comfortable sitting.  Maybe minimal effusion right knee.  Some hypertrophic change compared to the left knee.  Mostly medial joint pain diffusely from anterior to posterior.  Some patella crepitation but no pain with compression.  No pain laterally.  Full extension of flex at least 100 degrees without instability.  No popliteal pain or mass.  No calf pain.  Painless range of motion both hips.  Specialty Comments:  No specialty  comments available.  Imaging: XR KNEE 3 VIEW RIGHT  Result Date: 12/06/2021 Films of the right knee were obtained in 3 projections standing.  There are tricompartmental degenerative changes with peripheral osteophytes irregularity of the joint surface and some narrowing mostly medially.  No acute changes or ectopic calcification.  Films are consistent with moderate to advanced osteoarthritis    PMFS History: Patient Active Problem List   Diagnosis Date Noted   Unilateral primary osteoarthritis, right knee 12/06/2021   CONTUSION, RIGHT HAND 02/10/2009   Past Medical History:   Diagnosis Date   Concussion    COVID     History reviewed. No pertinent family history.  Past Surgical History:  Procedure Laterality Date   knee pain     ROTATOR CUFF REPAIR     Social History   Occupational History   Not on file  Tobacco Use   Smoking status: Former   Smokeless tobacco: Never  Substance and Sexual Activity   Alcohol use: No   Drug use: No   Sexual activity: Yes    Birth control/protection: None     Valeria Batman, MD   Note - This record has been created using Animal nutritionist.  Chart creation errors have been sought, but may not always  have been located. Such creation errors do not reflect on  the standard of medical care.

## 2022-03-01 ENCOUNTER — Encounter: Payer: Self-pay | Admitting: Orthopaedic Surgery

## 2022-03-01 ENCOUNTER — Ambulatory Visit (INDEPENDENT_AMBULATORY_CARE_PROVIDER_SITE_OTHER): Payer: Self-pay | Admitting: Orthopaedic Surgery

## 2022-03-01 DIAGNOSIS — M1711 Unilateral primary osteoarthritis, right knee: Secondary | ICD-10-CM

## 2022-03-01 MED ORDER — METHYLPREDNISOLONE ACETATE 40 MG/ML IJ SUSP
80.0000 mg | INTRAMUSCULAR | Status: AC | PRN
  Administered 2022-03-01: 80 mg via INTRA_ARTICULAR

## 2022-03-01 MED ORDER — BUPIVACAINE HCL 0.25 % IJ SOLN
2.0000 mL | INTRAMUSCULAR | Status: AC | PRN
  Administered 2022-03-01: 2 mL via INTRA_ARTICULAR

## 2022-03-01 MED ORDER — LIDOCAINE HCL 1 % IJ SOLN
2.0000 mL | INTRAMUSCULAR | Status: AC | PRN
  Administered 2022-03-01: 2 mL

## 2022-03-01 NOTE — Progress Notes (Signed)
Office Visit Note   Patient: Jonathan Woodard           Date of Birth: [birth year]           MRN: [id number] Visit Date: 03/01/2022              Requested by: Celene Squibb, MD [place],  [place] [address] PCP: Celene Squibb, MD   Assessment & Plan: Visit Diagnoses:  1. Unilateral primary osteoarthritis, right knee     Plan: [name] comes in today requesting a repeat cortisone injection into his right knee.  He has a history of osteoarthritis of the right knee.  He was last injected about 3 months ago.  He said it worked very well up until about a week ago.  Denies any other injuries.  We did go forward with an injection today we discussed that this not long-lasting we could do gel injections.  Hopefully we can defer from this as he is self-pay but if he said it came down to having to pay for them he would may follow-up to call as needed  Follow-Up Instructions: Return if symptoms worsen or fail to improve.   Orders:  No orders of the defined types were placed in this encounter.  No orders of the defined types were placed in this encounter.     Procedures: Large Joint Inj: R knee on 03/01/2022 2:18 PM Indications: pain and diagnostic evaluation Details: 25 G 1.5 in needle, anteromedial approach  Arthrogram: No  Medications: 80 mg methylPREDNISolone acetate 40 MG/ML; 2 mL lidocaine 1 %; 2 mL bupivacaine 0.25 % Outcome: tolerated well, no immediate complications Procedure, treatment alternatives, risks and benefits explained, specific risks discussed. Consent was given by the patient.     Clinical Data: No additional findings.   Subjective: Chief Complaint  Patient presents with  . Right Knee - Pain  Patient presents today for recurrent right knee pain. He received a cortisone injection in July and states that it helped until one week ago. He is hoping to get another cortisone injection today. He does not want to have surgery.   HPI  Review of Systems   All other systems reviewed and are negative.    Objective: Vital Signs: There were no vitals taken for this visit.  Physical Exam Constitutional:      Appearance: Normal appearance.  Pulmonary:     Effort: Pulmonary effort is normal.  Abdominal:     General: There is no distension.  Skin:    General: Skin is warm and dry.  Neurological:     General: No focal deficit present.     Mental Status: He is alert.    Ortho Exam Right knee he has no effusion no erythema no warmth has crepitus with range of motion good varus valgus stability distal circulation intact compartments are soft and nontender Specialty Comments:  No specialty comments available.  Imaging: No results found.   PMFS History: Patient Active Problem List   Diagnosis Date Noted  . Unilateral primary osteoarthritis, right knee 12/06/2021  . CONTUSION, RIGHT HAND 02/10/2009   Past Medical History:  Diagnosis Date  . Concussion   . COVID     History reviewed. No pertinent family history.  Past Surgical History:  Procedure Laterality Date  . knee pain    . ROTATOR CUFF REPAIR     Social History   Occupational History  . Not on file  Tobacco Use  . Smoking  status: Former  . Smokeless tobacco: Never  Substance and Sexual Activity  . Alcohol use: No  . Drug use: No  . Sexual activity: Yes    Birth control/protection: None

## 2022-07-31 DIAGNOSIS — E118 Type 2 diabetes mellitus with unspecified complications: Secondary | ICD-10-CM | POA: Diagnosis not present

## 2022-07-31 DIAGNOSIS — Z125 Encounter for screening for malignant neoplasm of prostate: Secondary | ICD-10-CM | POA: Diagnosis not present

## 2022-07-31 DIAGNOSIS — E785 Hyperlipidemia, unspecified: Secondary | ICD-10-CM | POA: Diagnosis not present

## 2022-08-06 ENCOUNTER — Other Ambulatory Visit (HOSPITAL_COMMUNITY): Payer: Self-pay | Admitting: Internal Medicine

## 2022-08-06 DIAGNOSIS — E118 Type 2 diabetes mellitus with unspecified complications: Secondary | ICD-10-CM | POA: Diagnosis not present

## 2022-08-06 DIAGNOSIS — S83206D Unspecified tear of unspecified meniscus, current injury, right knee, subsequent encounter: Secondary | ICD-10-CM | POA: Diagnosis not present

## 2022-08-06 DIAGNOSIS — I10 Essential (primary) hypertension: Secondary | ICD-10-CM | POA: Diagnosis not present

## 2022-08-06 DIAGNOSIS — E785 Hyperlipidemia, unspecified: Secondary | ICD-10-CM | POA: Diagnosis not present

## 2022-08-06 DIAGNOSIS — Z7182 Exercise counseling: Secondary | ICD-10-CM | POA: Diagnosis not present

## 2022-08-06 DIAGNOSIS — Z6833 Body mass index (BMI) 33.0-33.9, adult: Secondary | ICD-10-CM | POA: Diagnosis not present

## 2022-08-06 DIAGNOSIS — E1169 Type 2 diabetes mellitus with other specified complication: Secondary | ICD-10-CM | POA: Diagnosis not present

## 2022-08-06 DIAGNOSIS — E669 Obesity, unspecified: Secondary | ICD-10-CM | POA: Diagnosis not present

## 2022-08-06 DIAGNOSIS — Z713 Dietary counseling and surveillance: Secondary | ICD-10-CM | POA: Diagnosis not present

## 2022-08-06 DIAGNOSIS — X58XXXD Exposure to other specified factors, subsequent encounter: Secondary | ICD-10-CM | POA: Diagnosis not present

## 2022-08-08 ENCOUNTER — Encounter: Payer: Self-pay | Admitting: *Deleted

## 2022-08-23 ENCOUNTER — Telehealth: Payer: Self-pay | Admitting: *Deleted

## 2022-08-23 NOTE — Telephone Encounter (Signed)
  Procedure: Colonoscopy  Height: 5'11 Weight: 230lbs        Have you had a colonoscopy before?  Dr. Oneida Alar, ? When   Do you have family history of colon cancer?  no  Do you have a family history of polyps? no  Previous colonoscopy with polyps removed? no  Do you have a history colorectal cancer?   no  Are you diabetic?  Yes, type 2  Do you have a prosthetic or mechanical heart valve? no  Do you have a pacemaker/defibrillator?   no  Have you had endocarditis/atrial fibrillation?  no  Do you use supplemental oxygen/CPAP?  no  Have you had joint replacement within the last 12 months?  no  Do you tend to be constipated or have to use laxatives?  no   Do you have history of alcohol use? If yes, how much and how often.  no  Do you have history or are you using drugs? If yes, what do are you  using?  no  Have you ever had a stroke/heart attack?  no  Have you ever had a heart or other vascular stent placed,?no  Do you take weight loss medication? no  Do you take any blood-thinning medications such as: (Plavix, aspirin, Coumadin, Aggrenox, Brilinta, Xarelto, Eliquis, Pradaxa, Savaysa or Effient)? no  If yes we need the name, milligram, dosage and who is prescribing doctor:               Current Outpatient Medications  Medication Sig Dispense Refill   glipiZIDE (GLUCOTROL XL) 5 MG 24 hr tablet Take 5 mg by mouth daily with breakfast.     No current facility-administered medications for this visit.    Allergies  Allergen Reactions   Dilaudid [Hydromorphone Hcl] Nausea Only    Pt had syncopal episode after receiving 1 mg dilaudid

## 2022-09-12 NOTE — Telephone Encounter (Signed)
Attempted to call pt, no answer. 

## 2022-09-12 NOTE — Telephone Encounter (Signed)
ASA 2.   Hold glipizide morning of procedure. Per review of chart it appears patient may also be on metformin.  Please confirm this with the patient.  If he is on metformin he should hold this as well the night before and the morning of his procedure depending on when he takes it.

## 2022-09-17 ENCOUNTER — Encounter: Payer: Self-pay | Admitting: *Deleted

## 2022-09-17 NOTE — Telephone Encounter (Signed)
Attempted to call pt, no answer. Letter mailed

## 2022-09-18 ENCOUNTER — Telehealth: Payer: Self-pay | Admitting: Internal Medicine

## 2022-09-18 NOTE — Telephone Encounter (Signed)
Patient left a message he was needing to schedule a colonoscopy.  [phone] was the number he left.  I saw where a letter was sent out unable to reach him.

## 2022-09-19 NOTE — Telephone Encounter (Signed)
Called pt to get him scheduled for his colonoscopy and pt says he needed to be seen before doing that because he was having issues with his stomach. Transferred pt up front to schedule appointment.  Pt has an appointment on 09/25/22.

## 2022-09-19 NOTE — Telephone Encounter (Signed)
Called pt to get him scheduled for his colonoscopy and pt says he needed to be seen before doing that because he was having issues with his stomach. Transferred pt up front to schedule appointment.

## 2022-09-24 NOTE — Progress Notes (Unsigned)
GI Office Note    Referring Provider: Benita Stabile, MD Primary Care Physician:  Benita Stabile, MD  Primary Gastroenterologist: Hennie Duos. Marletta Lor, DO   Chief Complaint   No chief complaint on file.    History of Present Illness   Jonathan Woodard is a 62 y.o. male presenting today      Labs ***    Medications   Current Outpatient Medications  Medication Sig Dispense Refill   glipiZIDE (GLUCOTROL XL) 5 MG 24 hr tablet Take 5 mg by mouth daily with breakfast.     No current facility-administered medications for this visit.    Allergies   Allergies as of 09/25/2022 - Review Complete 08/23/2022  Allergen Reaction Noted   Dilaudid [hydromorphone hcl] Nausea Only 02/18/2011    Past Medical History   Past Medical History:  Diagnosis Date   Concussion    COVID     Past Surgical History   Past Surgical History:  Procedure Laterality Date   knee pain     ROTATOR CUFF REPAIR      Past Family History   No family history on file.  Past Social History   Social History   Socioeconomic History   Marital status: Married    Spouse name: Not on file   Number of children: Not on file   Years of education: Not on file   Highest education level: Not on file  Occupational History   Not on file  Tobacco Use   Smoking status: Former   Smokeless tobacco: Never  Substance and Sexual Activity   Alcohol use: No   Drug use: No   Sexual activity: Yes    Birth control/protection: None  Other Topics Concern   Not on file  Social History Narrative   Not on file   Social Determinants of Health   Financial Resource Strain: Not on file  Food Insecurity: Not on file  Transportation Needs: Not on file  Physical Activity: Not on file  Stress: Not on file  Social Connections: Not on file  Intimate Partner Violence: Not on file    Review of Systems   General: Negative for anorexia, weight loss, fever, chills, fatigue, weakness. Eyes: Negative for vision  changes.  ENT: Negative for hoarseness, difficulty swallowing , nasal congestion. CV: Negative for chest pain, angina, palpitations, dyspnea on exertion, peripheral edema.  Respiratory: Negative for dyspnea at rest, dyspnea on exertion, cough, sputum, wheezing.  GI: See history of present illness. GU:  Negative for dysuria, hematuria, urinary incontinence, urinary frequency, nocturnal urination.  MS: Negative for joint pain, low back pain.  Derm: Negative for rash or itching.  Neuro: Negative for weakness, abnormal sensation, seizure, frequent headaches, memory loss,  confusion.  Psych: Negative for anxiety, depression, suicidal ideation, hallucinations.  Endo: Negative for unusual weight change.  Heme: Negative for bruising or bleeding. Allergy: Negative for rash or hives.  Physical Exam   There were no vitals taken for this visit.   General: Well-nourished, well-developed in no acute distress.  Head: Normocephalic, atraumatic.   Eyes: Conjunctiva pink, no icterus. Mouth: Oropharyngeal mucosa moist and pink , no lesions erythema or exudate. Neck: Supple without thyromegaly, masses, or lymphadenopathy.  Lungs: Clear to auscultation bilaterally.  Heart: Regular rate and rhythm, no murmurs rubs or gallops.  Abdomen: Bowel sounds are normal, nontender, nondistended, no hepatosplenomegaly or masses,  no abdominal bruits or hernia, no rebound or guarding.   Rectal: *** Extremities: No lower extremity edema.  No clubbing or deformities.  Neuro: Alert and oriented x 4 , grossly normal neurologically.  Skin: Warm and dry, no rash or jaundice.   Psych: Alert and cooperative, normal mood and affect.  Labs   *** Imaging Studies   No results found.  Assessment       PLAN   ***   Leanna Battles. Melvyn Neth, MHS, PA-C [facility]

## 2022-09-25 ENCOUNTER — Ambulatory Visit: Payer: 59 | Admitting: Gastroenterology

## 2022-09-25 ENCOUNTER — Telehealth: Payer: Self-pay | Admitting: *Deleted

## 2022-09-25 ENCOUNTER — Encounter: Payer: Self-pay | Admitting: Gastroenterology

## 2022-09-25 VITALS — BP 146/82 | HR 96 | Temp 98.2°F | Ht 71.0 in | Wt 239.4 lb

## 2022-09-25 DIAGNOSIS — Z1211 Encounter for screening for malignant neoplasm of colon: Secondary | ICD-10-CM | POA: Insufficient documentation

## 2022-09-25 DIAGNOSIS — K30 Functional dyspepsia: Secondary | ICD-10-CM

## 2022-09-25 DIAGNOSIS — R1013 Epigastric pain: Secondary | ICD-10-CM | POA: Insufficient documentation

## 2022-09-25 NOTE — Patient Instructions (Signed)
Colonoscopy and upper endoscopy to be scheduled.

## 2022-09-25 NOTE — Telephone Encounter (Signed)
LMOVM to call back to schedule TCS/EGD with Dr. Marletta Lor, ASA 2

## 2022-09-26 ENCOUNTER — Encounter: Payer: Self-pay | Admitting: Gastroenterology

## 2022-09-27 ENCOUNTER — Encounter: Payer: Self-pay | Admitting: *Deleted

## 2022-09-27 ENCOUNTER — Other Ambulatory Visit: Payer: Self-pay | Admitting: *Deleted

## 2022-09-27 ENCOUNTER — Ambulatory Visit (HOSPITAL_COMMUNITY)
Admission: RE | Admit: 2022-09-27 | Discharge: 2022-09-27 | Disposition: A | Discharge status: Home / Self Care | Payer: 59 | Source: Ambulatory Visit | Attending: Internal Medicine | Admitting: Internal Medicine

## 2022-09-27 DIAGNOSIS — E785 Hyperlipidemia, unspecified: Secondary | ICD-10-CM

## 2022-09-27 MED ORDER — NA SULFATE-K SULFATE-MG SULF 17.5-3.13-1.6 GM/177ML PO SOLN: ORAL | 0 refills | Status: DC

## 2022-09-27 NOTE — Telephone Encounter (Signed)
Pt has been scheduled for 10/29/22. Instructions mailed and prep sent to the pharmacy.

## 2022-10-25 ENCOUNTER — Encounter (HOSPITAL_COMMUNITY)
Admission: RE | Admit: 2022-10-25 | Discharge: 2022-10-25 | Disposition: A | Discharge status: Home / Self Care | Payer: 59 | Source: Ambulatory Visit | Attending: Internal Medicine | Admitting: Internal Medicine

## 2022-10-25 ENCOUNTER — Encounter (HOSPITAL_COMMUNITY): Payer: Self-pay

## 2022-10-29 ENCOUNTER — Ambulatory Visit (HOSPITAL_COMMUNITY)
Admission: RE | Admit: 2022-10-29 | Discharge: 2022-10-29 | Disposition: A | Discharge status: Home / Self Care | Payer: 59 | Attending: Internal Medicine | Admitting: Internal Medicine

## 2022-10-29 ENCOUNTER — Ambulatory Visit (HOSPITAL_COMMUNITY): Payer: 59 | Admitting: Certified Registered Nurse Anesthetist

## 2022-10-29 ENCOUNTER — Ambulatory Visit (HOSPITAL_BASED_OUTPATIENT_CLINIC_OR_DEPARTMENT_OTHER): Payer: 59 | Admitting: Certified Registered Nurse Anesthetist

## 2022-10-29 ENCOUNTER — Encounter (HOSPITAL_COMMUNITY): Payer: Self-pay

## 2022-10-29 ENCOUNTER — Encounter (HOSPITAL_COMMUNITY)
Admission: RE | Disposition: A | Discharge status: Home / Self Care | Payer: Self-pay | Source: Home / Self Care | Attending: Internal Medicine

## 2022-10-29 DIAGNOSIS — D120 Benign neoplasm of cecum: Secondary | ICD-10-CM

## 2022-10-29 DIAGNOSIS — K635 Polyp of colon: Secondary | ICD-10-CM

## 2022-10-29 DIAGNOSIS — Z87891 Personal history of nicotine dependence: Secondary | ICD-10-CM | POA: Insufficient documentation

## 2022-10-29 DIAGNOSIS — Z1211 Encounter for screening for malignant neoplasm of colon: Secondary | ICD-10-CM | POA: Diagnosis not present

## 2022-10-29 DIAGNOSIS — K5790 Diverticulosis of intestine, part unspecified, without perforation or abscess without bleeding: Secondary | ICD-10-CM

## 2022-10-29 DIAGNOSIS — R1013 Epigastric pain: Secondary | ICD-10-CM | POA: Diagnosis not present

## 2022-10-29 DIAGNOSIS — E119 Type 2 diabetes mellitus without complications: Secondary | ICD-10-CM

## 2022-10-29 DIAGNOSIS — Z7984 Long term (current) use of oral hypoglycemic drugs: Secondary | ICD-10-CM | POA: Insufficient documentation

## 2022-10-29 DIAGNOSIS — K648 Other hemorrhoids: Secondary | ICD-10-CM | POA: Diagnosis not present

## 2022-10-29 DIAGNOSIS — Z8379 Family history of other diseases of the digestive system: Secondary | ICD-10-CM | POA: Insufficient documentation

## 2022-10-29 DIAGNOSIS — K649 Unspecified hemorrhoids: Secondary | ICD-10-CM

## 2022-10-29 DIAGNOSIS — K259 Gastric ulcer, unspecified as acute or chronic, without hemorrhage or perforation: Secondary | ICD-10-CM

## 2022-10-29 DIAGNOSIS — K2970 Gastritis, unspecified, without bleeding: Secondary | ICD-10-CM

## 2022-10-29 DIAGNOSIS — D125 Benign neoplasm of sigmoid colon: Secondary | ICD-10-CM | POA: Diagnosis not present

## 2022-10-29 DIAGNOSIS — K5730 Diverticulosis of large intestine without perforation or abscess without bleeding: Secondary | ICD-10-CM | POA: Diagnosis not present

## 2022-10-29 HISTORY — PX: COLONOSCOPY WITH PROPOFOL: SHX5780

## 2022-10-29 HISTORY — PX: ESOPHAGOGASTRODUODENOSCOPY (EGD) WITH PROPOFOL: SHX5813

## 2022-10-29 HISTORY — PX: POLYPECTOMY: SHX5525

## 2022-10-29 HISTORY — PX: BIOPSY: SHX5522

## 2022-10-29 LAB — GLUCOSE, CAPILLARY
Glucose-Capillary: 78 mg/dL (ref 70–99)
Glucose-Capillary: 73 mg/dL (ref 70–99)

## 2022-10-29 SURGERY — COLONOSCOPY WITH PROPOFOL
Anesthesia: General

## 2022-10-29 MED ORDER — LIDOCAINE HCL (CARDIAC) PF 100 MG/5ML IV SOSY
PREFILLED_SYRINGE | INTRAVENOUS | Status: DC | PRN
  Administered 2022-10-29: 80 mg via INTRAVENOUS

## 2022-10-29 MED ORDER — PROPOFOL 500 MG/50ML IV EMUL
INTRAVENOUS | Status: DC | PRN
  Administered 2022-10-29: 125 ug/kg/min via INTRAVENOUS

## 2022-10-29 MED ORDER — PANTOPRAZOLE SODIUM 40 MG PO TBEC: 40.0000 mg | DELAYED_RELEASE_TABLET | Freq: Two times a day (BID) | ORAL | 11 refills | Status: DC

## 2022-10-29 MED ORDER — LACTATED RINGERS IV SOLN: INTRAVENOUS | Status: DC

## 2022-10-29 MED ORDER — PROPOFOL 10 MG/ML IV BOLUS
INTRAVENOUS | Status: DC | PRN
  Administered 2022-10-29: 125 mg via INTRAVENOUS

## 2022-10-29 NOTE — Anesthesia Preprocedure Evaluation (Signed)
Anesthesia Evaluation  Patient identified by MRN, date of birth, ID band Patient awake    Reviewed: Allergy & Precautions, H&P , NPO status , Patient's Chart, lab work & pertinent test results, reviewed documented beta blocker date and time   Airway Mallampati: II  TM Distance: >3 FB Neck ROM: full    Dental no notable dental hx.    Pulmonary neg pulmonary ROS, former smoker   Pulmonary exam normal breath sounds clear to auscultation       Cardiovascular Exercise Tolerance: Good negative cardio ROS  Rhythm:regular Rate:Normal     Neuro/Psych negative neurological ROS  negative psych ROS   GI/Hepatic negative GI ROS, Neg liver ROS,,,  Endo/Other  negative endocrine ROSdiabetes    Renal/GU negative Renal ROS  negative genitourinary   Musculoskeletal   Abdominal   Peds  Hematology negative hematology ROS (+)   Anesthesia Other Findings   Reproductive/Obstetrics negative OB ROS                             Anesthesia Physical Anesthesia Plan  ASA: 2  Anesthesia Plan: General   Post-op Pain Management:    Induction:   PONV Risk Score and Plan: Propofol infusion  Airway Management Planned:   Additional Equipment:   Intra-op Plan:   Post-operative Plan:   Informed Consent: I have reviewed the patients History and Physical, chart, labs and discussed the procedure including the risks, benefits and alternatives for the proposed anesthesia with the patient or authorized representative who has indicated his/her understanding and acceptance.     Dental Advisory Given  Plan Discussed with: CRNA  Anesthesia Plan Comments:        Anesthesia Quick Evaluation

## 2022-10-29 NOTE — Anesthesia Procedure Notes (Signed)
Date/Time: 10/29/2022 8:23 AM  Performed by: Franco Nones, CRNAPre-anesthesia Checklist: Patient identified, Emergency Drugs available, Suction available, Timeout performed and Patient being monitored Patient Re-evaluated:Patient Re-evaluated prior to induction Comments: Hi FLow Optiflow

## 2022-10-29 NOTE — H&P (Signed)
Primary Care Physician:  Benita Stabile, MD Primary Gastroenterologist:  Dr. Marletta Lor  Pre-Procedure History & Physical: HPI:  Jonathan Woodard is a 62 y.o. male is here for an EGD for epigastric pain/indigestion and a colonoscopy to be performed for colon cancer screening purposes.  Past Medical History:  Diagnosis Date   Concussion    COVID    DM (diabetes mellitus) (HCC)     Past Surgical History:  Procedure Laterality Date   KNEE ARTHROSCOPY     ROTATOR CUFF REPAIR Bilateral     Prior to Admission medications   Medication Sig Start Date End Date Taking? Authorizing Provider  glipiZIDE (GLUCOTROL XL) 5 MG 24 hr tablet Take 5 mg by mouth daily with breakfast. 08/06/22  Yes [provider]  Na Sulfate-K Sulfate-Mg Sulf 17.5-3.13-1.6 GM/177ML SOLN As directed 09/27/22  Yes Lanelle Bal, DO    Allergies as of 09/27/2022 - Review Complete 09/25/2022  Allergen Reaction Noted   Dilaudid [hydromorphone hcl] Nausea Only 02/18/2011    Family History  Problem Relation Age of Onset   Diverticulitis Mother        ended up with colostomy bag   Dementia Father    Colon cancer Neg Hx     Social History   Socioeconomic History   Marital status: Married    Spouse name: Not on file   Number of children: Not on file   Years of education: Not on file   Highest education level: Not on file  Occupational History   Not on file  Tobacco Use   Smoking status: Former   Smokeless tobacco: Never  Substance and Sexual Activity   Alcohol use: No   Drug use: No   Sexual activity: Yes    Birth control/protection: None  Other Topics Concern   Not on file  Social History Narrative   Not on file   Social Determinants of Health   Financial Resource Strain: Not on file  Food Insecurity: Not on file  Transportation Needs: Not on file  Physical Activity: Not on file  Stress: Not on file  Social Connections: Not on file  Intimate Partner Violence: Not on file    Review of  Systems: See HPI, otherwise negative ROS  Physical Exam: Vital signs in last 24 hours: Temp:  [97.9 F (36.6 C)] 97.9 F (36.6 C) (06/03 0703) Pulse Rate:  [78] 78 (06/03 0703) Resp:  [15] 15 (06/03 0703) BP: (131)/(92) 131/92 (06/03 0703) SpO2:  [95 %] 95 % (06/03 0703) Weight:  [104.8 kg] 104.8 kg (06/03 0703)   General:   Alert,  Well-developed, well-nourished, pleasant and cooperative in NAD Head:  Normocephalic and atraumatic. Eyes:  Sclera clear, no icterus.   Conjunctiva pink. Ears:  Normal auditory acuity. Nose:  No deformity, discharge,  or lesions. Msk:  Symmetrical without gross deformities. Normal posture. Extremities:  Without clubbing or edema. Neurologic:  Alert and  oriented x4;  grossly normal neurologically. Skin:  Intact without significant lesions or rashes. Psych:  Alert and cooperative. Normal mood and affect.  Impression/Plan: Jonathan Woodard is here for an EGD for epigastric pain/indigestion and a colonoscopy to be performed for colon cancer screening purposes.  The risks of the procedure including infection, bleed, or perforation as well as benefits, limitations, alternatives and imponderables have been reviewed with the patient. Questions have been answered. All parties agreeable.

## 2022-10-29 NOTE — Discharge Instructions (Addendum)
EGD Discharge instructions Please read the instructions outlined below and refer to this sheet in the next few weeks. These discharge instructions provide you with general information on caring for yourself after you leave the hospital. Your doctor may also give you specific instructions. While your treatment has been planned according to the most current medical practices available, unavoidable complications occasionally occur. If you have any problems or questions after discharge, please call your doctor. ACTIVITY You may resume your regular activity but move at a slower pace for the next 24 hours.  Take frequent rest periods for the next 24 hours.  Walking will help expel (get rid of) the air and reduce the bloated feeling in your abdomen.  No driving for 24 hours (because of the anesthesia (medicine) used during the test).  You may shower.  Do not sign any important legal documents or operate any machinery for 24 hours (because of the anesthesia used during the test).  NUTRITION Drink plenty of fluids.  You may resume your normal diet.  Begin with a light meal and progress to your normal diet.  Avoid alcoholic beverages for 24 hours or as instructed by your caregiver.  MEDICATIONS You may resume your normal medications unless your caregiver tells you otherwise.  WHAT YOU CAN EXPECT TODAY You may experience abdominal discomfort such as a feeling of fullness or "gas" pains.  FOLLOW-UP Your doctor will discuss the results of your test with you.  SEEK IMMEDIATE MEDICAL ATTENTION IF ANY OF THE FOLLOWING OCCUR: Excessive nausea (feeling sick to your stomach) and/or vomiting.  Severe abdominal pain and distention (swelling).  Trouble swallowing.  Temperature over 101 F (37.8 C).  Rectal bleeding or vomiting of blood.     Colonoscopy Discharge Instructions  Read the instructions outlined below and refer to this sheet in the next few weeks. These discharge instructions provide you with  general information on caring for yourself after you leave the hospital. Your doctor may also give you specific instructions. While your treatment has been planned according to the most current medical practices available, unavoidable complications occasionally occur.   ACTIVITY You may resume your regular activity, but move at a slower pace for the next 24 hours.  Take frequent rest periods for the next 24 hours.  Walking will help get rid of the air and reduce the bloated feeling in your belly (abdomen).  No driving for 24 hours (because of the medicine (anesthesia) used during the test).   Do not sign any important legal documents or operate any machinery for 24 hours (because of the anesthesia used during the test).  NUTRITION Drink plenty of fluids.  You may resume your normal diet as instructed by your doctor.  Begin with a light meal and progress to your normal diet. Heavy or fried foods are harder to digest and may make you feel sick to your stomach (nauseated).  Avoid alcoholic beverages for 24 hours or as instructed.  MEDICATIONS You may resume your normal medications unless your doctor tells you otherwise.  WHAT YOU CAN EXPECT TODAY Some feelings of bloating in the abdomen.  Passage of more gas than usual.  Spotting of blood in your stool or on the toilet paper.  IF YOU HAD POLYPS REMOVED DURING THE COLONOSCOPY: No aspirin products for 7 days or as instructed.  No alcohol for 7 days or as instructed.  Eat a soft diet for the next 24 hours.  FINDING OUT THE RESULTS OF YOUR TEST Not all test results are  available during your visit. If your test results are not back during the visit, make an appointment with your caregiver to find out the results. Do not assume everything is normal if you have not heard from your caregiver or the medical facility. It is important for you to follow up on all of your test results.  SEEK IMMEDIATE MEDICAL ATTENTION IF: You have more than a spotting of  blood in your stool.  Your belly is swollen (abdominal distention).  You are nauseated or vomiting.  You have a temperature over 101.  You have abdominal pain or discomfort that is severe or gets worse throughout the day.   Your EGD revealed moderate amount inflammation in your stomach.  1 small ulcer noted as well. I took biopsies of this to rule out infection with a bacteria called H. pylori.  Await pathology results, my office will contact you.  Esophagus and small bowel appeared normal.  I am going to start you on a new medication called pantoprazole 40 mg twice daily for the next 12 weeks at which point you can decrease down to once daily.  Your colonoscopy revealed 2 small polyp(s) which I removed successfully. Await pathology results, my office will contact you. I recommend repeating colonoscopy in 7-10 years for surveillance purposes depending on pathology results.   You also have diverticulosis and hemorrhoids. I would recommend increasing fiber in your diet or adding OTC Benefiber/Metamucil. Be sure to drink at least 4 to 6 glasses of water daily. Follow-up with GI in 3 months    I hope you have a great rest of your week!  Hennie Duos. Marletta Lor, D.O. Gastroenterology and Hepatology [facility]

## 2022-10-29 NOTE — Op Note (Signed)
[facility] Patient Name: Jonathan Woodard Procedure Date: 10/29/2022 8:10 AM MRN: [id number] Date of Birth: [birth year] Attending MD: Hennie Duos. Marletta Lor , [place], [id number] CSN: [id number] Age: 62 Admit Type: Outpatient Procedure:                Colonoscopy Indications:              Screening for colorectal malignant neoplasm Providers:                Hennie Duos. Marletta Lor, DO, Crystal Page, Angelica Ran,                            Dyann Ruddle, Pandora Leiter, Technician Referring MD:              Medicines:                See the Anesthesia note for documentation of the                            administered medications Complications:            No immediate complications. Estimated Blood Loss:     Estimated blood loss was minimal. Procedure:                Pre-Anesthesia Assessment:                           - The anesthesia plan was to use monitored                            anesthesia care (MAC).                           After obtaining informed consent, the colonoscope                            was passed under direct vision. Throughout the                            procedure, the patient's blood pressure, pulse, and                            oxygen saturations were monitored continuously. The                            PCF-HQ190L ([id number]) scope was introduced through                            the anus and advanced to the the cecum, identified                            by appendiceal orifice and ileocecal valve. The                            colonoscopy was performed without difficulty. The                            patient tolerated  the procedure well. The quality                            of the bowel preparation was evaluated using the                            BBPS [facility] Bowel Preparation Scale) with scores                            of: Right Colon = 2 (minor amount of residual                            staining, small fragments of stool and/or opaque                             liquid, but mucosa seen well), Transverse Colon = 3                            (entire mucosa seen well with no residual staining,                            small fragments of stool or opaque liquid) and Left                            Colon = 3 (entire mucosa seen well with no residual                            staining, small fragments of stool or opaque                            liquid). The total BBPS score equals 8. The quality                            of the bowel preparation was good. Scope In: 8:36:50 AM Scope Out: 8:48:40 AM Scope Withdrawal Time: 0 hours 10 minutes 31 seconds  Total Procedure Duration: 0 hours 11 minutes 50 seconds  Findings:      Hemorrhoids were found on perianal exam.      Multiple large-mouthed and small-mouthed diverticula were found in the       sigmoid colon, descending colon and transverse colon.      A 4 mm polyp was found in the cecum. The polyp was sessile. The polyp       was removed with a cold snare. Resection and retrieval were complete.      A 3 mm polyp was found in the sigmoid colon. The polyp was sessile. The       polyp was removed with a cold snare. Resection and retrieval were       complete.      The exam was otherwise without abnormality. Impression:               - Hemorrhoids found on perianal exam.                           - Diverticulosis  in the sigmoid colon, in the                            descending colon and in the transverse colon.                           - One 4 mm polyp in the cecum, removed with a cold                            snare. Resected and retrieved.                           - One 3 mm polyp in the sigmoid colon, removed with                            a cold snare. Resected and retrieved.                           - The examination was otherwise normal. Moderate Sedation:      Per Anesthesia Care Recommendation:           - Patient has a contact number available for                             emergencies. The signs and symptoms of potential                            delayed complications were discussed with the                            patient. Return to normal activities tomorrow.                            Written discharge instructions were provided to the                            patient.                           - Resume previous diet.                           - Continue present medications.                           - Await pathology results.                           - Repeat colonoscopy in 7-10 years for surveillance.                           - Return to GI clinic PRN. Procedure Code(s):        --- Professional ---                           [id number], Colonoscopy, flexible;  with removal of                            tumor(s), polyp(s), or other lesion(s) by snare                            technique Diagnosis Code(s):        --- Professional ---                           Z12.11, Encounter for screening for malignant                            neoplasm of colon                           K64.8, Other hemorrhoids                           D12.0, Benign neoplasm of cecum                           D12.5, Benign neoplasm of sigmoid colon                           K57.30, Diverticulosis of large intestine without                            perforation or abscess without bleeding CPT copyright 2022 American [employer] Association. All rights reserved. The codes documented in this report are preliminary and upon coder review may  be revised to meet current compliance requirements. Hennie Duos. Marletta Lor, DO Hennie Duos. Marletta Lor, DO 10/29/2022 8:51:29 AM This report has been signed electronically. Number of Addenda: 0

## 2022-10-29 NOTE — Transfer of Care (Signed)
Immediate Anesthesia Transfer of Care Note  Patient: Jonathan Woodard  Procedure(s) Performed: COLONOSCOPY WITH PROPOFOL ESOPHAGOGASTRODUODENOSCOPY (EGD) WITH PROPOFOL BIOPSY POLYPECTOMY  Patient Location: Short Stay  Anesthesia Type:General  Level of Consciousness: awake and patient cooperative  Airway & Oxygen Therapy: Patient Spontanous Breathing  Post-op Assessment: Report given to RN and Post -op Vital signs reviewed and stable  Post vital signs: Reviewed and stable  Last Vitals:  Vitals Value Taken Time  BP 123/73 10/29/22 0851  Temp 36.4 C 10/29/22 0851  Pulse 81 10/29/22 0851  Resp 15 10/29/22 0851  SpO2 97 % 10/29/22 0851    Last Pain:  Vitals:   10/29/22 0851  TempSrc:   PainSc: 0-No pain      Patients Stated Pain Goal: 7 (10/29/22 0703)  Complications: No notable events documented.

## 2022-10-29 NOTE — Op Note (Signed)
[facility] Patient Name: Jonathan Woodard Procedure Date: 10/29/2022 8:13 AM MRN: [id number] Date of Birth: 06/16/1960 Attending MD: Hennie Duos. Marletta Lor , [place], [id number] CSN: [id number] Age: 62 Admit Type: Outpatient Procedure:                Upper GI endoscopy Indications:              Epigastric abdominal pain Providers:                Hennie Duos. Marletta Lor, DO, Crystal Page, Angelica Ran,                            Pandora Leiter, Technician, Dyann Ruddle Referring MD:              Medicines:                See the Anesthesia note for documentation of the                            administered medications Complications:            No immediate complications. Estimated Blood Loss:     Estimated blood loss was minimal. Procedure:                Pre-Anesthesia Assessment:                           - The anesthesia plan was to use monitored                            anesthesia care (MAC).                           After obtaining informed consent, the endoscope was                            passed under direct vision. Throughout the                            procedure, the patient's blood pressure, pulse, and                            oxygen saturations were monitored continuously. The                            GIF-H190 ([id number]) scope was introduced through the                            mouth, and advanced to the second part of duodenum.                            The upper GI endoscopy was accomplished without                            difficulty. The patient tolerated the procedure                            well.  Scope In: 8:31:28 AM Scope Out: 8:33:47 AM Total Procedure Duration: 0 hours 2 minutes 19 seconds  Findings:      The Z-line was regular and was found 39 cm from the incisors.      Diffuse moderate inflammation characterized by erosions and erythema was       found in the entire examined stomach. Biopsies were taken with a cold       forceps for Helicobacter pylori  testing.      One non-bleeding superficial gastric ulcer with no stigmata of bleeding       was found in the gastric body. The lesion was 2 mm in largest dimension.      The duodenal bulb, first portion of the duodenum and second portion of       the duodenum were normal. Impression:               - Z-line regular, 39 cm from the incisors.                           - Gastritis. Biopsied.                           - Non-bleeding gastric ulcer with no stigmata of                            bleeding.                           - Normal duodenal bulb, first portion of the                            duodenum and second portion of the duodenum. Moderate Sedation:      Per Anesthesia Care Recommendation:           - Patient has a contact number available for                            emergencies. The signs and symptoms of potential                            delayed complications were discussed with the                            patient. Return to normal activities tomorrow.                            Written discharge instructions were provided to the                            patient.                           - Resume previous diet.                           - Continue present medications.                           -  Await pathology results.                           - Use Protonix (pantoprazole) 40 mg PO BID for 12                            weeks.                           - No ibuprofen, naproxen, or other non-steroidal                            anti-inflammatory drugs. Procedure Code(s):        --- Professional ---                           [phone], Esophagogastroduodenoscopy, flexible,                            transoral; with biopsy, single or multiple Diagnosis Code(s):        --- Professional ---                           K29.70, Gastritis, unspecified, without bleeding                           K25.9, Gastric ulcer, unspecified as acute or                            chronic,  without hemorrhage or perforation                           R10.13, Epigastric pain CPT copyright 2022 American Medical Association. All rights reserved. The codes documented in this report are preliminary and upon coder review may  be revised to meet current compliance requirements. Hennie Duos. Marletta Lor, DO Hennie Duos. Marletta Lor, DO 10/29/2022 8:36:15 AM This report has been signed electronically. Number of Addenda: 0

## 2022-10-30 LAB — SURGICAL PATHOLOGY

## 2022-11-01 NOTE — Anesthesia Postprocedure Evaluation (Signed)
Anesthesia Post Note  Patient: Jonathan Woodard  Procedure(s) Performed: COLONOSCOPY WITH PROPOFOL ESOPHAGOGASTRODUODENOSCOPY (EGD) WITH PROPOFOL BIOPSY POLYPECTOMY  Patient location during evaluation: Phase II Anesthesia Type: General Level of consciousness: awake Pain management: pain level controlled Vital Signs Assessment: post-procedure vital signs reviewed and stable Respiratory status: spontaneous breathing and respiratory function stable Cardiovascular status: blood pressure returned to baseline and stable Postop Assessment: no headache and no apparent nausea or vomiting Anesthetic complications: no Comments: Late entry   No notable events documented.   Last Vitals:  Vitals:   10/29/22 0703 10/29/22 0851  BP: (!) 131/92 123/73  Pulse: 78 81  Resp: 15 15  Temp: 36.6 C (!) 36.4 C  SpO2: 95% 97%    Last Pain:  Vitals:   10/29/22 0851  TempSrc:   PainSc: 0-No pain                 Windell Norfolk

## 2022-11-05 ENCOUNTER — Encounter (HOSPITAL_COMMUNITY): Payer: Self-pay | Admitting: Internal Medicine

## 2022-11-13 DIAGNOSIS — E118 Type 2 diabetes mellitus with unspecified complications: Secondary | ICD-10-CM | POA: Diagnosis not present

## 2022-11-13 DIAGNOSIS — E785 Hyperlipidemia, unspecified: Secondary | ICD-10-CM | POA: Diagnosis not present

## 2022-11-19 DIAGNOSIS — I10 Essential (primary) hypertension: Secondary | ICD-10-CM | POA: Diagnosis not present

## 2022-11-19 DIAGNOSIS — Z7182 Exercise counseling: Secondary | ICD-10-CM | POA: Diagnosis not present

## 2022-11-19 DIAGNOSIS — S83206D Unspecified tear of unspecified meniscus, current injury, right knee, subsequent encounter: Secondary | ICD-10-CM | POA: Diagnosis not present

## 2022-11-19 DIAGNOSIS — Z713 Dietary counseling and surveillance: Secondary | ICD-10-CM | POA: Diagnosis not present

## 2022-11-19 DIAGNOSIS — Z6833 Body mass index (BMI) 33.0-33.9, adult: Secondary | ICD-10-CM | POA: Diagnosis not present

## 2022-11-19 DIAGNOSIS — E118 Type 2 diabetes mellitus with unspecified complications: Secondary | ICD-10-CM | POA: Diagnosis not present

## 2022-11-19 DIAGNOSIS — E785 Hyperlipidemia, unspecified: Secondary | ICD-10-CM | POA: Diagnosis not present

## 2022-11-19 DIAGNOSIS — E669 Obesity, unspecified: Secondary | ICD-10-CM | POA: Diagnosis not present

## 2022-11-19 DIAGNOSIS — Z2821 Immunization not carried out because of patient refusal: Secondary | ICD-10-CM | POA: Diagnosis not present

## 2022-11-19 DIAGNOSIS — E1169 Type 2 diabetes mellitus with other specified complication: Secondary | ICD-10-CM | POA: Diagnosis not present

## 2023-01-10 ENCOUNTER — Encounter: Payer: Self-pay | Admitting: Internal Medicine

## 2023-01-29 NOTE — Progress Notes (Signed)
GI Office Note    Referring Provider: Benita Stabile, MD Primary Care Physician:  Benita Stabile, MD  Primary Gastroenterologist: Hennie Duos. Marletta Lor, DO   Chief Complaint   Chief Complaint  Patient presents with   Follow-up    Not any better, states that the pantoprazole made him sleep all the time and made him have no energy so he quit taking it. States he was unable to get anyone on the phone to report the effects to [place].     History of Present Illness   Jonathan Woodard is a 62 y.o. male presenting today for follow up. Seen in the office 08/2022. Has completed EGD/colonoscopy as outlined below.  Labs March 2024: White blood cell count 9100, hemoglobin 14.2, platelets 322,000, glucose 106, creatinine 0.94, albumin 4.2, total bilirubin 0.4, alkaline phosphatase 86, AST 23, ALT 21, A1c 6.8.   CT abdomen pelvis with contrast April 2022 while in the ED: IMPRESSION: -Bowel wall thickening of small bowel loops in the RIGHT pelvis with hyperemia of associated mesentery consistent with enteritis; differential diagnosis favors infectious versus inflammatory bowel disease etiologies. -Diverticulosis  Today: states after his EGD he was placed on pantoprazole 40mg  BID for 12 weeks. However after three weeks on pantoprazole he stopped the medication because he noted side effect of drowsiness/fatigue. These symptoms seemed to get better off pantoprazole. Unfortunately his reports his stomach is no better. Continues to have alot of belching, abdominal cramping. Tries Rolaids for gas. Sometimes abdominal cramping better after BM. Typically has 2-3 stools in the mornings. Stools are normal to loose. No melena, brbpr. After eating he "gets sick". Stays away from peanuts, seeds, due to diverticulosis. Mother had complicated diverticulitis and required colostomy.   EGD 10/2022: -Zline regular -gastritis s/p bx, predominantly oxyntic type mucosa with no significant patholgy. No h.pylori -non-bleeding  gastric ulcer with no stigmata of bleeding  Colonoscopy 10/2022: -hemorrhoids -multiple large mouthed and small mouthed diverticula in sigmoid colon, descending colon, transverse colon -4mm polyp cecum, sessile mild superficial hyperplastic change -3mm polyp sigmoid colon, sessile, hyperplastic polyp -next tcs in 10 years  Medications   Current Outpatient Medications  Medication Sig Dispense Refill   glipiZIDE (GLUCOTROL XL) 5 MG 24 hr tablet Take 5 mg by mouth daily with breakfast.     No current facility-administered medications for this visit.    Allergies   Allergies as of 01/30/2023 - Review Complete 01/30/2023  Allergen Reaction Noted   Dilaudid [hydromorphone hcl] Nausea Only 02/18/2011     Past Medical History   Past Medical History:  Diagnosis Date   Concussion    COVID    DM (diabetes mellitus) (HCC)     Past Surgical History   Past Surgical History:  Procedure Laterality Date   BIOPSY  10/29/2022   Procedure: BIOPSY;  Surgeon: Lanelle Bal, DO;  [facility];  Service: Endoscopy;;   COLONOSCOPY WITH PROPOFOL N/A 10/29/2022   Procedure: COLONOSCOPY WITH PROPOFOL;  Surgeon: Lanelle Bal, DO;  [facility];  Service: Endoscopy;  Laterality: N/A;  8:00 am, ASA 2   ESOPHAGOGASTRODUODENOSCOPY (EGD) WITH PROPOFOL N/A 10/29/2022   Procedure: ESOPHAGOGASTRODUODENOSCOPY (EGD) WITH PROPOFOL;  Surgeon: Lanelle Bal, DO;  [facility];  Service: Endoscopy;  Laterality: N/A;   KNEE ARTHROSCOPY     POLYPECTOMY  10/29/2022   Procedure: POLYPECTOMY;  Surgeon: Lanelle Bal, DO;  [facility];  Service: Endoscopy;;   ROTATOR CUFF REPAIR  Bilateral     Past Family History   Family History  Problem Relation Age of Onset   Diverticulitis Mother        ended up with colostomy bag   Dementia Father    Colon cancer Neg Hx     Past Social History   Social History   Socioeconomic History   Marital status: Married     Spouse name: Not on file   Number of children: Not on file   Years of education: Not on file   Highest education level: Not on file  Occupational History   Not on file  Tobacco Use   Smoking status: Former   Smokeless tobacco: Never  Substance and Sexual Activity   Alcohol use: No   Drug use: No   Sexual activity: Yes    Birth control/protection: None  Other Topics Concern   Not on file  Social History Narrative   Not on file   Social Determinants of Health   Financial Resource Strain: Not on file  Food Insecurity: Not on file  Transportation Needs: Not on file  Physical Activity: Not on file  Stress: Not on file  Social Connections: Not on file  Intimate Partner Violence: Not on file    Review of Systems   General: Negative for anorexia, weight loss, fever, chills, fatigue, weakness. ENT: Negative for hoarseness, difficulty swallowing , nasal congestion. CV: Negative for chest pain, angina, palpitations, dyspnea on exertion, peripheral edema.  Respiratory: Negative for dyspnea at rest, dyspnea on exertion, cough, sputum, wheezing.  GI: See history of present illness. GU:  Negative for dysuria, hematuria, urinary incontinence, urinary frequency, nocturnal urination.  Endo: Negative for unusual weight change.     Physical Exam   BP (!) 141/89 (BP Location: Right Arm, Patient Position: Sitting, Cuff Size: Large)   Pulse 82   Temp 98 F (36.7 C) (Oral)   Ht 5\' 11"  (1.803 m)   Wt 241 lb 3.2 oz (109.4 kg)   SpO2 97%   BMI 33.64 kg/m    General: Well-nourished, well-developed in no acute distress.  Eyes: No icterus. Mouth: Oropharyngeal mucosa moist and pink   Lungs: Clear to auscultation bilaterally.  Heart: Regular rate and rhythm, no murmurs rubs or gallops.  Abdomen: Bowel sounds are normal, nontender, nondistended, no hepatosplenomegaly or masses,  no abdominal bruits or hernia , no rebound or guarding.  Rectal: not performed  Extremities: No lower  extremity edema. No clubbing or deformities. Neuro: Alert and oriented x 4   Skin: Warm and dry, no jaundice.   Psych: Alert and cooperative, normal mood and affect.  Labs   See hpi Imaging Studies   No results found.  Assessment   *Epigastric pain *Gastric ulcer/gastritis *Indigestion  Recent EGD with gastritis and non-bleeding gastric ulcer. Not on PPI at that time as patient has been apprehensive of medications. He took pantoprazole for 3 weeks but felt like this caused drowsiness and fatigue so he stopped the medication, with resolution of symptoms.   At this time due to ongoing symptoms, we will try a different PPI.   For diverticulosis, add daily fiber supplement. Hopefully this will also improve his stool consistency.  PLAN   Add benefiber two teaspoons daily for one week, increase to bid if tolerated.  Add rabeprazole 20mg  daily before breakfast, for at least 3 months due to history of gastric ulcer as well as treat indigestion.  To discuss with Dr. Marletta Lor regarding if follow up EGD needed due  to gastric ulcer.  Patient reported issues getting staff on the phone/only getting voicemail, but also stated he did not leave a message. Encouraged him to leave message so staff would be able to return his call and answer any questions/concerns.   Leanna Battles. Melvyn Neth, MHS, PA-C [facility]

## 2023-01-30 ENCOUNTER — Ambulatory Visit: Payer: 59 | Admitting: Gastroenterology

## 2023-01-30 ENCOUNTER — Encounter: Payer: Self-pay | Admitting: Gastroenterology

## 2023-01-30 VITALS — BP 141/89 | HR 82 | Temp 98.0°F | Ht 71.0 in | Wt 241.2 lb

## 2023-01-30 DIAGNOSIS — R1013 Epigastric pain: Secondary | ICD-10-CM

## 2023-01-30 DIAGNOSIS — K253 Acute gastric ulcer without hemorrhage or perforation: Secondary | ICD-10-CM | POA: Diagnosis not present

## 2023-01-30 DIAGNOSIS — K30 Functional dyspepsia: Secondary | ICD-10-CM

## 2023-01-30 MED ORDER — RABEPRAZOLE SODIUM 20 MG PO TBEC: 20.0000 mg | DELAYED_RELEASE_TABLET | Freq: Every day | ORAL | 3 refills | Status: DC

## 2023-01-30 NOTE — Patient Instructions (Addendum)
Add Benefiber two teaspoons once daily for one week. If tolerated, you can increase to twice daily. This is helpful for diverticulosis and regulating bowels.  Add rabeprazole 20mg  daily before breakfast for stomach ulcer, belching, indigestion. Try to take at least three months to heal the ulcer. If helps your symptoms, you can continue longer.  I will be in touch with you regarding any possible need for repeat upper endoscopy to verify ulcer healing after discussing with Dr. Marletta Lor. Please call my CMA Tammy at [phone] if you have any questions or concerns.

## 2023-01-31 DIAGNOSIS — E118 Type 2 diabetes mellitus with unspecified complications: Secondary | ICD-10-CM | POA: Diagnosis not present

## 2023-01-31 DIAGNOSIS — E785 Hyperlipidemia, unspecified: Secondary | ICD-10-CM | POA: Diagnosis not present

## 2023-02-06 DIAGNOSIS — I10 Essential (primary) hypertension: Secondary | ICD-10-CM | POA: Diagnosis not present

## 2023-02-06 DIAGNOSIS — Z713 Dietary counseling and surveillance: Secondary | ICD-10-CM | POA: Diagnosis not present

## 2023-02-06 DIAGNOSIS — K635 Polyp of colon: Secondary | ICD-10-CM | POA: Diagnosis not present

## 2023-02-06 DIAGNOSIS — Z6833 Body mass index (BMI) 33.0-33.9, adult: Secondary | ICD-10-CM | POA: Diagnosis not present

## 2023-02-06 DIAGNOSIS — S83206D Unspecified tear of unspecified meniscus, current injury, right knee, subsequent encounter: Secondary | ICD-10-CM | POA: Diagnosis not present

## 2023-02-06 DIAGNOSIS — Z7182 Exercise counseling: Secondary | ICD-10-CM | POA: Diagnosis not present

## 2023-02-06 DIAGNOSIS — E669 Obesity, unspecified: Secondary | ICD-10-CM | POA: Diagnosis not present

## 2023-02-06 DIAGNOSIS — E118 Type 2 diabetes mellitus with unspecified complications: Secondary | ICD-10-CM | POA: Diagnosis not present

## 2023-02-06 DIAGNOSIS — E1169 Type 2 diabetes mellitus with other specified complication: Secondary | ICD-10-CM | POA: Diagnosis not present

## 2023-02-06 DIAGNOSIS — Z8711 Personal history of peptic ulcer disease: Secondary | ICD-10-CM | POA: Diagnosis not present

## 2023-02-09 DIAGNOSIS — K259 Gastric ulcer, unspecified as acute or chronic, without hemorrhage or perforation: Secondary | ICD-10-CM | POA: Insufficient documentation

## 2023-03-18 ENCOUNTER — Telehealth: Payer: Self-pay | Admitting: Gastroenterology

## 2023-03-18 NOTE — Telephone Encounter (Signed)
Please let patient know that I received follow up from Dr. Marletta Lor regarding his gastric ulcer.  He does not recommend follow up EGD to verify ulcer healing as the ulcer was superficial and looked benign.   I would recommend he have follow up ov in 8 weeks, continue PPI started at last ov.

## 2023-03-19 NOTE — Telephone Encounter (Signed)
Lmom for pt to return call. 

## 2023-03-20 NOTE — Telephone Encounter (Signed)
Pt was made aware and verbalized understanding. F/U appt was made.

## 2023-05-13 ENCOUNTER — Ambulatory Visit: Payer: 59 | Admitting: Gastroenterology

## 2023-05-13 ENCOUNTER — Encounter: Payer: Self-pay | Admitting: Gastroenterology

## 2023-05-13 NOTE — Progress Notes (Unsigned)
GI Office Note    Referring Provider: Benita Stabile, MD Primary Care Physician:  Benita Stabile, MD  Primary Gastroenterologist: Hennie Duos. Marletta Lor, DO   Chief Complaint   No chief complaint on file.   History of Present Illness   Jonathan Woodard is a 62 y.o. male presenting today for follow up. Last seen in 01/2023. He has history of gastritis/gastric ulcer.         EGD 10/2022: -Zline regular -gastritis s/p bx, predominantly oxyntic type mucosa with no significant patholgy. No h.pylori -non-bleeding gastric ulcer with no stigmata of bleeding   Colonoscopy 10/2022: -hemorrhoids -multiple large mouthed and small mouthed diverticula in sigmoid colon, descending colon, transverse colon -4mm polyp cecum, sessile mild superficial hyperplastic change -3mm polyp sigmoid colon, sessile, hyperplastic polyp -next tcs in 10 years      Medications   Current Outpatient Medications  Medication Sig Dispense Refill   glipiZIDE (GLUCOTROL XL) 5 MG 24 hr tablet Take 5 mg by mouth daily with breakfast.     RABEprazole (ACIPHEX) 20 MG tablet Take 1 tablet (20 mg total) by mouth daily before breakfast. 90 tablet 3   No current facility-administered medications for this visit.    Allergies   Allergies as of 05/13/2023 - Review Complete 01/30/2023  Allergen Reaction Noted   Dilaudid [hydromorphone hcl] Nausea Only 02/18/2011     Past Medical History   Past Medical History:  Diagnosis Date   Concussion    COVID    DM (diabetes mellitus) (HCC)     Past Surgical History   Past Surgical History:  Procedure Laterality Date   BIOPSY  10/29/2022   Procedure: BIOPSY;  Surgeon: Lanelle Bal, DO;  [facility];  Service: Endoscopy;;   COLONOSCOPY WITH PROPOFOL N/A 10/29/2022   Procedure: COLONOSCOPY WITH PROPOFOL;  Surgeon: Lanelle Bal, DO;  [facility];  Service: Endoscopy;  Laterality: N/A;  8:00 am, ASA 2   ESOPHAGOGASTRODUODENOSCOPY (EGD) WITH  PROPOFOL N/A 10/29/2022   Procedure: ESOPHAGOGASTRODUODENOSCOPY (EGD) WITH PROPOFOL;  Surgeon: Lanelle Bal, DO;  [facility];  Service: Endoscopy;  Laterality: N/A;   KNEE ARTHROSCOPY     POLYPECTOMY  10/29/2022   Procedure: POLYPECTOMY;  Surgeon: Lanelle Bal, DO;  [facility];  Service: Endoscopy;;   ROTATOR CUFF REPAIR Bilateral     Past Family History   Family History  Problem Relation Age of Onset   Diverticulitis Mother        ended up with colostomy bag   Dementia Father    Colon cancer Neg Hx     Past Social History   Social History   Socioeconomic History   Marital status: Married    Spouse name: Not on file   Number of children: Not on file   Years of education: Not on file   Highest education level: Not on file  Occupational History   Not on file  Tobacco Use   Smoking status: Former   Smokeless tobacco: Never  Substance and Sexual Activity   Alcohol use: No   Drug use: No   Sexual activity: Yes    Birth control/protection: None  Other Topics Concern   Not on file  Social History Narrative   Not on file   Social Drivers of Health   Financial Resource Strain: Not on file  Food Insecurity: Not on file  Transportation Needs: Not on file  Physical Activity: Not on file  Stress: Not on file  Social Connections: Not on file  Intimate Partner Violence: Not on file    Review of Systems   General: Negative for anorexia, weight loss, fever, chills, fatigue, weakness. ENT: Negative for hoarseness, difficulty swallowing , nasal congestion. CV: Negative for chest pain, angina, palpitations, dyspnea on exertion, peripheral edema.  Respiratory: Negative for dyspnea at rest, dyspnea on exertion, cough, sputum, wheezing.  GI: See history of present illness. GU:  Negative for dysuria, hematuria, urinary incontinence, urinary frequency, nocturnal urination.  Endo: Negative for unusual weight change.     Physical Exam   There  were no vitals taken for this visit.   General: Well-nourished, well-developed in no acute distress.  Eyes: No icterus. Mouth: Oropharyngeal mucosa moist and pink , no lesions erythema or exudate. Lungs: Clear to auscultation bilaterally.  Heart: Regular rate and rhythm, no murmurs rubs or gallops.  Abdomen: Bowel sounds are normal, nontender, nondistended, no hepatosplenomegaly or masses,  no abdominal bruits or hernia , no rebound or guarding.  Rectal: ***  Extremities: No lower extremity edema. No clubbing or deformities. Neuro: Alert and oriented x 4   Skin: Warm and dry, no jaundice.   Psych: Alert and cooperative, normal mood and affect.  Labs   *** Imaging Studies   No results found.  Assessment       PLAN   ***   Leanna Battles. Melvyn Neth, MHS, PA-C [facility]

## 2023-07-31 DIAGNOSIS — Z125 Encounter for screening for malignant neoplasm of prostate: Secondary | ICD-10-CM | POA: Diagnosis not present

## 2023-07-31 DIAGNOSIS — E669 Obesity, unspecified: Secondary | ICD-10-CM | POA: Diagnosis not present

## 2023-07-31 DIAGNOSIS — E785 Hyperlipidemia, unspecified: Secondary | ICD-10-CM | POA: Diagnosis not present

## 2023-07-31 DIAGNOSIS — E118 Type 2 diabetes mellitus with unspecified complications: Secondary | ICD-10-CM | POA: Diagnosis not present

## 2023-08-06 DIAGNOSIS — I10 Essential (primary) hypertension: Secondary | ICD-10-CM | POA: Diagnosis not present

## 2023-08-06 DIAGNOSIS — E785 Hyperlipidemia, unspecified: Secondary | ICD-10-CM | POA: Diagnosis not present

## 2023-08-06 DIAGNOSIS — E1165 Type 2 diabetes mellitus with hyperglycemia: Secondary | ICD-10-CM | POA: Diagnosis not present

## 2023-08-06 DIAGNOSIS — E1169 Type 2 diabetes mellitus with other specified complication: Secondary | ICD-10-CM | POA: Diagnosis not present

## 2023-08-06 DIAGNOSIS — E669 Obesity, unspecified: Secondary | ICD-10-CM | POA: Diagnosis not present

## 2023-08-06 DIAGNOSIS — E118 Type 2 diabetes mellitus with unspecified complications: Secondary | ICD-10-CM | POA: Diagnosis not present

## 2023-08-06 DIAGNOSIS — S83206D Unspecified tear of unspecified meniscus, current injury, right knee, subsequent encounter: Secondary | ICD-10-CM | POA: Diagnosis not present

## 2023-09-11 DIAGNOSIS — M25511 Pain in right shoulder: Secondary | ICD-10-CM | POA: Diagnosis not present

## 2023-10-10 ENCOUNTER — Ambulatory Visit (HOSPITAL_COMMUNITY)

## 2023-11-13 DIAGNOSIS — M25511 Pain in right shoulder: Secondary | ICD-10-CM | POA: Diagnosis not present

## 2023-11-20 DIAGNOSIS — M25511 Pain in right shoulder: Secondary | ICD-10-CM | POA: Diagnosis not present

## 2023-12-04 DIAGNOSIS — S46011A Strain of muscle(s) and tendon(s) of the rotator cuff of right shoulder, initial encounter: Secondary | ICD-10-CM | POA: Diagnosis not present

## 2023-12-13 DIAGNOSIS — H6091 Unspecified otitis externa, right ear: Secondary | ICD-10-CM | POA: Diagnosis not present

## 2024-02-04 DIAGNOSIS — E118 Type 2 diabetes mellitus with unspecified complications: Secondary | ICD-10-CM | POA: Diagnosis not present

## 2024-02-07 DIAGNOSIS — E118 Type 2 diabetes mellitus with unspecified complications: Secondary | ICD-10-CM | POA: Diagnosis not present

## 2024-02-07 DIAGNOSIS — I10 Essential (primary) hypertension: Secondary | ICD-10-CM | POA: Diagnosis not present

## 2024-02-07 DIAGNOSIS — K635 Polyp of colon: Secondary | ICD-10-CM | POA: Diagnosis not present

## 2024-02-07 DIAGNOSIS — E785 Hyperlipidemia, unspecified: Secondary | ICD-10-CM | POA: Diagnosis not present

## 2024-03-29 ENCOUNTER — Ambulatory Visit
Admission: EM | Admit: 2024-03-29 | Discharge: 2024-03-29 | Disposition: A | Discharge status: Home / Self Care | Attending: Family Medicine | Admitting: Family Medicine

## 2024-03-29 DIAGNOSIS — S161XXA Strain of muscle, fascia and tendon at neck level, initial encounter: Secondary | ICD-10-CM

## 2024-03-29 MED ORDER — PREDNISONE 20 MG PO TABS: 20.0000 mg | ORAL_TABLET | Freq: Every day | ORAL | 0 refills | Status: AC

## 2024-03-29 MED ORDER — TIZANIDINE HCL 4 MG PO CAPS: 4.0000 mg | ORAL_CAPSULE | Freq: Three times a day (TID) | ORAL | 0 refills | Status: AC | PRN

## 2024-03-29 NOTE — Discharge Instructions (Signed)
 In addition to the prescribed medications, you may use heat, massage, muscle rubs, Tylenol  as needed for pain.

## 2024-03-29 NOTE — ED Triage Notes (Signed)
 Pt reports neck pain and stiffness  x 2 weeks intermittently, pt has been using tylenol  for the pain and discomfort.

## 2024-04-01 NOTE — ED Provider Notes (Signed)
 [facility]    CSN: [id number] Arrival date & time: 03/29/24  1527      History   Chief Complaint No chief complaint on file.   HPI Jonathan Woodard is a 63 y.o. male.   Presenting today with 2 week history of left sided neck pain and stiffness. Denies decreased ROM, numbness, tingling, weakness, headache, injury to area, CP, SOB. So far trying tylenol  with minimal relief.     Past Medical History:  Diagnosis Date   Concussion    COVID    DM (diabetes mellitus) (HCC)     Patient Active Problem List   Diagnosis Date Noted   Gastric ulcer 02/09/2023   Indigestion 09/25/2022   Encounter for screening colonoscopy 09/25/2022   Abdominal pain, epigastric 09/25/2022   Unilateral primary osteoarthritis, right knee 12/06/2021   CONTUSION, RIGHT HAND 02/10/2009    Past Surgical History:  Procedure Laterality Date   BIOPSY  10/29/2022   Procedure: BIOPSY;  Surgeon: Cindie Carlin POUR, DO;  [facility];  Service: Endoscopy;;   COLONOSCOPY WITH PROPOFOL  N/A 10/29/2022   Procedure: COLONOSCOPY WITH PROPOFOL ;  Surgeon: Cindie Carlin POUR, DO;  [facility];  Service: Endoscopy;  Laterality: N/A;  8:00 am, ASA 2   ESOPHAGOGASTRODUODENOSCOPY (EGD) WITH PROPOFOL  N/A 10/29/2022   Procedure: ESOPHAGOGASTRODUODENOSCOPY (EGD) WITH PROPOFOL ;  Surgeon: Cindie Carlin POUR, DO;  [facility];  Service: Endoscopy;  Laterality: N/A;   KNEE ARTHROSCOPY     POLYPECTOMY  10/29/2022   Procedure: POLYPECTOMY;  Surgeon: Cindie Carlin POUR, DO;  [facility];  Service: Endoscopy;;   ROTATOR CUFF REPAIR Bilateral        Home Medications    Prior to Admission medications   Medication Sig Start Date End Date Taking? Authorizing Provider  predniSONE (DELTASONE) 20 MG tablet Take 1 tablet (20 mg total) by mouth daily with breakfast. 03/29/24  Yes Stuart Vernell Norris, PA-C  tiZANidine (ZANAFLEX) 4 MG capsule Take 1 capsule (4 mg total) by mouth 3 (three)  times daily as needed for muscle spasms. Do not drink alcohol or drive while taking this medication.  May cause drowsiness. 03/29/24  Yes Stuart Vernell Norris, PA-C  glipiZIDE (GLUCOTROL XL) 5 MG 24 hr tablet Take 5 mg by mouth daily with breakfast. 08/06/22   [provider]    Family History Family History  Problem Relation Age of Onset   Diverticulitis Mother        ended up with colostomy bag   Dementia Father    Colon cancer Neg Hx     Social History Social History   Tobacco Use   Smoking status: Former   Smokeless tobacco: Never  Substance Use Topics   Alcohol use: No   Drug use: No     Allergies   Dilaudid  [hydromorphone  hcl]   Review of Systems Review of Systems PER HPI  Physical Exam Triage Vital Signs ED Triage Vitals  Encounter Vitals Group     BP 03/29/24 1535 (!) 160/113     Girls Systolic BP Percentile --      Girls Diastolic BP Percentile --      Boys Systolic BP Percentile --      Boys Diastolic BP Percentile --      Pulse Rate 03/29/24 1535 96     Resp 03/29/24 1535 20     Temp 03/29/24 1535 98.3 F (36.8 C)     Temp Source 03/29/24 1535 Oral  SpO2 03/29/24 1535 93 %     Weight --      Height --      Head Circumference --      Peak Flow --      Pain Score 03/29/24 1537 3     Pain Loc --      Pain Education --      Exclude from Growth Chart --    No data found.  Updated Vital Signs BP (!) 160/113 (BP Location: Right Arm)   Pulse 96   Temp 98.3 F (36.8 C) (Oral)   Resp 20   SpO2 93%   Visual Acuity Right Eye Distance:   Left Eye Distance:   Bilateral Distance:    Right Eye Near:   Left Eye Near:    Bilateral Near:     Physical Exam Vitals and nursing note reviewed.  Constitutional:      Appearance: Normal appearance.  HENT:     Head: Atraumatic.  Eyes:     Extraocular Movements: Extraocular movements intact.     Conjunctiva/sclera: Conjunctivae normal.  Cardiovascular:     Rate and Rhythm: Normal rate  and regular rhythm.  Pulmonary:     Effort: Pulmonary effort is normal.     Breath sounds: Normal breath sounds.  Musculoskeletal:        General: Tenderness present. No swelling. Normal range of motion.     Cervical back: Normal range of motion and neck supple.     Comments: No midline spinal ttp diffusely. ROM intact throughout. TTP to left SCM and trapezius region  Skin:    General: Skin is warm and dry.  Neurological:     Mental Status: He is oriented to person, place, and time.     Comments: B/l UEs neurovascularly intact  Psychiatric:        Mood and Affect: Mood normal.        Thought Content: Thought content normal.        Judgment: Judgment normal.      UC Treatments / Results  Labs (all labs ordered are listed, but only abnormal results are displayed) Labs Reviewed - No data to display  EKG   Radiology No results found.  Procedures Procedures (including critical care time)  Medications Ordered in UC Medications - No data to display  Initial Impression / Assessment and Plan / UC Course  I have reviewed the triage vital signs and the nursing notes.  Pertinent labs & imaging results that were available during my care of the patient were reviewed by me and considered in my medical decision making (see chart for details).     Consistent with neck strain. Treat with prednisone, zanaflex, heat, massage, stretches, rest. Return for worsening or unresolving sxs.  Final Clinical Impressions(s) / UC Diagnoses   Final diagnoses:  Strain of neck muscle, initial encounter     Discharge Instructions      In addition to the prescribed medications, you may use heat, massage, muscle rubs, Tylenol  as needed for pain.    ED Prescriptions     Medication Sig Dispense Auth. Provider   predniSONE (DELTASONE) 20 MG tablet Take 1 tablet (20 mg total) by mouth daily with breakfast. 4 tablet Stuart Vernell Norris, PA-C   tiZANidine (ZANAFLEX) 4 MG capsule Take 1  capsule (4 mg total) by mouth 3 (three) times daily as needed for muscle spasms. Do not drink alcohol or drive while taking this medication.  May cause drowsiness. 15 capsule Stuart Vernell  Almarie, PA-C      PDMP not reviewed this encounter.   Stuart Millman [place], [place] 04/01/24 [phone]
